# Patient Record
Sex: Female | Born: 1970 | Race: White | Hispanic: No | Marital: Married | Smoking: Never smoker
Health system: Southern US, Community
[De-identification: ages and names within clinical notes are randomized; demographics above are authoritative.]

## PROBLEM LIST (undated history)

## (undated) DIAGNOSIS — K219 Gastro-esophageal reflux disease without esophagitis: Secondary | ICD-10-CM

## (undated) DIAGNOSIS — F419 Anxiety disorder, unspecified: Secondary | ICD-10-CM

## (undated) DIAGNOSIS — G4700 Insomnia, unspecified: Secondary | ICD-10-CM

## (undated) DIAGNOSIS — Q560 Hermaphroditism, not elsewhere classified: Secondary | ICD-10-CM

## (undated) HISTORY — DX: Hermaphroditism, not elsewhere classified: Q56.0

## (undated) HISTORY — DX: Insomnia, unspecified: G47.00

## (undated) HISTORY — PX: AUGMENTATION MAMMAPLASTY: SUR837

## (undated) HISTORY — PX: TONSILLECTOMY: SUR1361

## (undated) HISTORY — PX: OTHER SURGICAL HISTORY: SHX169

## (undated) HISTORY — PX: BREAST ENHANCEMENT SURGERY: SHX7

## (undated) HISTORY — DX: Anxiety disorder, unspecified: F41.9

## (undated) HISTORY — DX: Gastro-esophageal reflux disease without esophagitis: K21.9

---

## 2006-08-07 ENCOUNTER — Ambulatory Visit: Payer: Self-pay | Admitting: Family Medicine

## 2006-08-09 ENCOUNTER — Ambulatory Visit: Payer: Self-pay | Admitting: Family Medicine

## 2006-08-09 LAB — CONVERTED CEMR LAB
ALT: 15 units/L (ref 0–35)
AST: 20 units/L (ref 0–37)
Alkaline Phosphatase: 40 units/L (ref 39–117)
Basophils Relative: 0 % (ref 0–1)
Bilirubin, Direct: 0.1 mg/dL (ref 0.0–0.3)
Chloride: 104 meq/L (ref 96–112)
Eosinophils Absolute: 0.1 10*3/uL (ref 0.0–0.7)
Indirect Bilirubin: 0.3 mg/dL (ref 0.0–0.9)
Lymphs Abs: 2.2 10*3/uL (ref 0.7–3.3)
MCHC: 34.4 g/dL (ref 30.0–36.0)
MCV: 96.6 fL (ref 78.0–100.0)
Neutro Abs: 2.4 10*3/uL (ref 1.7–7.7)
Neutrophils Relative %: 48 % (ref 43–77)
Platelets: 220 10*3/uL (ref 150–400)
Potassium: 4.1 meq/L (ref 3.5–5.3)
RBC: 3.79 M/uL — ABNORMAL LOW (ref 3.87–5.11)
Sodium: 138 meq/L (ref 135–145)
Total Bilirubin: 0.4 mg/dL (ref 0.3–1.2)
WBC: 5 10*3/uL (ref 4.0–10.5)

## 2006-11-04 DIAGNOSIS — K219 Gastro-esophageal reflux disease without esophagitis: Secondary | ICD-10-CM | POA: Insufficient documentation

## 2007-05-23 ENCOUNTER — Ambulatory Visit: Payer: Self-pay | Admitting: Family Medicine

## 2007-05-23 DIAGNOSIS — G4700 Insomnia, unspecified: Secondary | ICD-10-CM | POA: Insufficient documentation

## 2007-05-23 DIAGNOSIS — R0989 Other specified symptoms and signs involving the circulatory and respiratory systems: Secondary | ICD-10-CM

## 2007-05-23 DIAGNOSIS — R0609 Other forms of dyspnea: Secondary | ICD-10-CM

## 2007-05-23 DIAGNOSIS — J309 Allergic rhinitis, unspecified: Secondary | ICD-10-CM | POA: Insufficient documentation

## 2007-06-24 ENCOUNTER — Telehealth: Payer: Self-pay | Admitting: Family Medicine

## 2007-06-25 ENCOUNTER — Telehealth: Payer: Self-pay | Admitting: Family Medicine

## 2007-08-12 ENCOUNTER — Encounter: Payer: Self-pay | Admitting: Family Medicine

## 2007-08-12 ENCOUNTER — Telehealth (INDEPENDENT_AMBULATORY_CARE_PROVIDER_SITE_OTHER): Payer: Self-pay | Admitting: *Deleted

## 2007-09-01 ENCOUNTER — Telehealth: Payer: Self-pay | Admitting: Family Medicine

## 2007-10-23 ENCOUNTER — Telehealth: Payer: Self-pay | Admitting: Family Medicine

## 2007-12-11 ENCOUNTER — Telehealth: Payer: Self-pay | Admitting: Family Medicine

## 2007-12-26 ENCOUNTER — Ambulatory Visit: Payer: Self-pay | Admitting: Family Medicine

## 2007-12-26 DIAGNOSIS — H109 Unspecified conjunctivitis: Secondary | ICD-10-CM | POA: Insufficient documentation

## 2008-04-02 ENCOUNTER — Telehealth: Payer: Self-pay | Admitting: Family Medicine

## 2008-04-08 ENCOUNTER — Ambulatory Visit: Payer: Self-pay | Admitting: Family Medicine

## 2008-04-08 DIAGNOSIS — L259 Unspecified contact dermatitis, unspecified cause: Secondary | ICD-10-CM

## 2008-05-27 ENCOUNTER — Telehealth: Payer: Self-pay | Admitting: Family Medicine

## 2008-08-25 ENCOUNTER — Telehealth: Payer: Self-pay | Admitting: Family Medicine

## 2008-08-26 ENCOUNTER — Encounter: Payer: Self-pay | Admitting: Family Medicine

## 2008-10-07 ENCOUNTER — Telehealth (INDEPENDENT_AMBULATORY_CARE_PROVIDER_SITE_OTHER): Payer: Self-pay | Admitting: *Deleted

## 2008-10-08 ENCOUNTER — Telehealth: Payer: Self-pay | Admitting: Family Medicine

## 2008-10-08 DIAGNOSIS — R002 Palpitations: Secondary | ICD-10-CM

## 2008-10-14 ENCOUNTER — Encounter: Payer: Self-pay | Admitting: Family Medicine

## 2008-10-14 ENCOUNTER — Ambulatory Visit: Payer: Self-pay

## 2008-10-22 ENCOUNTER — Ambulatory Visit: Payer: Self-pay | Admitting: Family Medicine

## 2008-10-22 DIAGNOSIS — J069 Acute upper respiratory infection, unspecified: Secondary | ICD-10-CM | POA: Insufficient documentation

## 2008-12-02 ENCOUNTER — Telehealth: Payer: Self-pay | Admitting: Family Medicine

## 2009-02-09 ENCOUNTER — Telehealth: Payer: Self-pay | Admitting: Family Medicine

## 2009-02-09 ENCOUNTER — Encounter: Payer: Self-pay | Admitting: Family Medicine

## 2009-03-31 ENCOUNTER — Telehealth: Payer: Self-pay | Admitting: Family Medicine

## 2009-06-23 ENCOUNTER — Telehealth: Payer: Self-pay | Admitting: Family Medicine

## 2009-08-03 ENCOUNTER — Telehealth: Payer: Self-pay | Admitting: Family Medicine

## 2009-08-03 DIAGNOSIS — K649 Unspecified hemorrhoids: Secondary | ICD-10-CM

## 2009-09-02 ENCOUNTER — Telehealth: Payer: Self-pay | Admitting: Family Medicine

## 2009-09-05 ENCOUNTER — Telehealth (INDEPENDENT_AMBULATORY_CARE_PROVIDER_SITE_OTHER): Payer: Self-pay | Admitting: *Deleted

## 2009-09-06 ENCOUNTER — Ambulatory Visit: Payer: Self-pay | Admitting: Family Medicine

## 2009-09-06 ENCOUNTER — Telehealth: Payer: Self-pay | Admitting: Family Medicine

## 2009-09-06 DIAGNOSIS — F411 Generalized anxiety disorder: Secondary | ICD-10-CM | POA: Insufficient documentation

## 2009-09-20 ENCOUNTER — Telehealth (INDEPENDENT_AMBULATORY_CARE_PROVIDER_SITE_OTHER): Payer: Self-pay

## 2009-11-03 ENCOUNTER — Encounter (INDEPENDENT_AMBULATORY_CARE_PROVIDER_SITE_OTHER): Payer: Self-pay | Admitting: *Deleted

## 2009-11-04 ENCOUNTER — Ambulatory Visit: Payer: Self-pay | Admitting: Family Medicine

## 2009-11-04 LAB — CONVERTED CEMR LAB
Bilirubin Urine: NEGATIVE
Ketones, urine, test strip: NEGATIVE
Protein, U semiquant: NEGATIVE
Urobilinogen, UA: 0.2

## 2009-11-07 ENCOUNTER — Telehealth: Payer: Self-pay | Admitting: Family Medicine

## 2009-11-07 LAB — CONVERTED CEMR LAB
ALT: 17 units/L (ref 0–35)
BUN: 15 mg/dL (ref 6–23)
Bilirubin, Direct: 0.1 mg/dL (ref 0.0–0.3)
Calcium: 8.9 mg/dL (ref 8.4–10.5)
Chloride: 108 meq/L (ref 96–112)
Cholesterol: 132 mg/dL (ref 0–200)
Creatinine, Ser: 0.8 mg/dL (ref 0.4–1.2)
Eosinophils Absolute: 0.1 10*3/uL (ref 0.0–0.7)
Eosinophils Relative: 1 % (ref 0.0–5.0)
HDL: 60.5 mg/dL (ref >39.00)
LDL Cholesterol: 63 mg/dL (ref 0–99)
MCV: 100.2 fL — ABNORMAL HIGH (ref 78.0–100.0)
Monocytes Absolute: 0.4 10*3/uL (ref 0.1–1.0)
Neutrophils Relative %: 74.2 % (ref 43.0–77.0)
Platelets: 184 10*3/uL (ref 150.0–400.0)
Total Bilirubin: 0.6 mg/dL (ref 0.3–1.2)
Triglycerides: 43 mg/dL (ref 0.0–149.0)
VLDL: 8.6 mg/dL (ref 0.0–40.0)
WBC: 9.1 10*3/uL (ref 4.5–10.5)

## 2009-12-29 ENCOUNTER — Telehealth: Payer: Self-pay | Admitting: Family Medicine

## 2010-03-01 ENCOUNTER — Telehealth: Payer: Self-pay | Admitting: Family Medicine

## 2010-03-29 NOTE — Progress Notes (Signed)
Summary: Pt req partial refill of Zolpidem to last until ov on 09/06/09  Phone Note Call from Patient Call back at [facility] Phone [phone]   Caller: Patient Summary of Call: Pt called and needs a partial refill of Zolpidem, to last until ov on 09/06/09. Call in to [facility] [address] and [address].    Initial call taken by: Lucy Antigua,  September 02, 2009 4:32 PM  Follow-up for Phone Call        call in Zolpidem 10 mg at bedtime, #30 with no rf Follow-up by: Nelwyn Salisbury MD,  September 05, 2009 8:34 AM    Prescriptions: AMBIEN 10 MG  TABS (ZOLPIDEM TARTRATE) take a half  to one tablet by mouth every night at bedtime as needed for insomnia  #30 x 0   Entered by:   Raechel Ache, RN   Authorized by:   Nelwyn Salisbury MD   Signed by:   Raechel Ache, RN on 09/05/2009   Method used:   Historical   RxID:   [id number]

## 2010-03-29 NOTE — Letter (Signed)
Summary: [facility] Referral (unable to schedule) Notification  [facility]  [address] [place], [place] [address]   Phone: [phone]  Fax: [phone]      November 03, 2009 Cheryl Ritter 03-Sep-1970 MRN: [id number]   [facility] [address] [place], [place]  [address]   Dear Dr.Fry:   Thank you for your kind referral of the above patient. We have attempted to schedule the recommended Consultation but have been unable to schedule because:  _X_ The patient was not available by phone and/or has not returned our calls.  __ The patient declined to schedule the procedure at this time.  We appreciate the referral and hope that we will have the opportunity to treat this patient in the future.    Sincerely,   [facility]  Vania Rea. Jarold Motto M.D. Hedwig Morton. Juanda Chance M.D. Venita Lick. Russella Dar M.D. Wilhemina Bonito. Marina Goodell M.D. Barbette Hair. Arlyce Dice M.D. Iva Boop M.D. Cheron Every.D.

## 2010-03-29 NOTE — Progress Notes (Signed)
Summary: new pharmacy request  Phone Note Refill Request Call back at Home Phone [phone] Message from:  Patient---live call  Refills Requested: Medication #1:  FEXOFENADINE HCL 180 MG TABS 1 once daily as needed allergies New pharmacy----[facility] [address]  Initial call taken by: Warnell Forester,  June 23, 2009 10:27 AM  Follow-up for Phone Call        call in #30 with 11 rf Follow-up by: Nelwyn Salisbury MD,  June 23, 2009 11:54 AM    Prescriptions: FEXOFENADINE HCL 180 MG TABS (FEXOFENADINE HCL) 1 once daily as needed allergies  #30 x 11   Entered by:   Raechel Ache, RN   Authorized by:   Nelwyn Salisbury MD   Signed by:   Raechel Ache, RN on 06/23/2009   Method used:   Electronically to        [facility]. [address] [phone]* (retail)       [address]       [place], [place]  [address]       Ph: [phone] or [phone]       Fax: [phone]   RxID:   [id number]

## 2010-03-29 NOTE — Progress Notes (Signed)
Summary: rx zolpidem  Phone Note From Pharmacy   Caller: [facility] [address]. [phone]* Reason for Call: Needs renewal Summary of Call: refill zolpidem Initial call taken by: Pura Spice, RN,  November 07, 2009 4:44 PM  Follow-up for Phone Call        I gave her a 6 month rx when she was here on 11-04-09 Follow-up by: Nelwyn Salisbury MD,  November 07, 2009 5:07 PM

## 2010-03-29 NOTE — Progress Notes (Signed)
Summary: new rx needed  Phone Note Refill Request Call back at Home Phone [phone] Message from:  Patient---live call  Refills Requested: Medication #1:  FLUOXETINE HCL 20 MG CAPS once daily.  Medication #2:  generic [facility]zac [facility]-- [phone]  Initial call taken by: Warnell Forester,  December 29, 2009 9:17 AM  Follow-up for Phone Call        we already gave her a one year supply on 11-04-09 Follow-up by: Nelwyn Salisbury MD,  December 29, 2009 9:45 AM  Additional Follow-up for Phone Call Additional follow up Details #1::        pt aware Additional Follow-up by: Pura Spice, RN,  December 29, 2009 10:09 AM

## 2010-03-29 NOTE — Progress Notes (Signed)
Summary: refill [employer] Note From Pharmacy   Caller: [facility] [address]. [phone]* Call For: Dejia Ebron  Summary of Call: refill ambien cr 6.25mg  1 by mouth at bedtime Initial call taken by: Alfred Levins, CMA,  March 31, 2009 9:52 AM  Follow-up for Phone Call        call in #30 with 5 rf Follow-up by: Nelwyn Salisbury MD,  March 31, 2009 10:48 AM  Additional Follow-up for Phone Call Additional follow up Details #1::        Phone call completed, Pharmacist called Additional Follow-up by: Alfred Levins, CMA,  March 31, 2009 10:58 AM    Prescriptions: AMBIEN 10 MG  TABS (ZOLPIDEM TARTRATE) take a half  to one tablet by mouth every night at bedtime as needed for insomnia  #30 x 5   Entered by:   Alfred Levins, CMA   Authorized by:   Nelwyn Salisbury MD   Signed by:   Alfred Levins, CMA on 03/31/2009   Method used:   Telephoned to ...       [facility] [address] [phone]* (retail)       [address]       [place], [place]  [address]       Ph: [phone] or [phone]       Fax: [phone]   RxID:   [id number]

## 2010-03-29 NOTE — Progress Notes (Signed)
Summary: Pt has questions re: new med prescribed  Phone Note Call from Patient Call back at [facility] Phone [phone]   Caller: Patient Summary of Call: Pt called and has some questions re: the new medicine she just prescribed. Pls call.  Initial call taken by: Lucy Antigua,  September 06, 2009 4:22 PM  Follow-up for Phone Call        lmtcb Follow-up by: Raechel Ache, RN,  September 06, 2009 4:29 PM

## 2010-03-29 NOTE — Progress Notes (Signed)
Summary: Call-A-Nurse Report    Call-A-Nurse Triage Call Report Triage Record Num: [id number] Operator: Octaviano Glow Patient Name: Cheryl Ritter Call Date & Time: 09/03/2009 3:35:43PM Patient Phone: [phone] PCP: Tera Mater. Clent Ridges Patient Gender: Female PCP Fax : [phone] Patient DOB: 16-Oct-1970 Practice Name: [facility] Reason for Call: Pt calling about MD calling Ambien 10 mg to pharmacy and it was supposed to be Ambien 6.5 mg XR. Dr Laury Axon paged. Pt advised I would call her back after speaking to MD on call. Protocol(s) Used: Medication Question Calls, No Triage (Adults) Recommended Outcome per Protocol: Call Provider within 24 Hours Reason for Outcome: Caller has non urgent medication question about med that PCP prescribed and triager unable to answer question Care Advice:  ~ 09/03/2009 3:50:09PM Page 1 of 1 [id number]

## 2010-03-29 NOTE — Progress Notes (Signed)
Summary: gi referral  Phone Note Call from Patient Call back at Work Phone [phone]   Caller: Patient---live call Reason for Call: Referral Summary of Call: Wants GI doctor. Having hemorrhoids issues. Please referral. Do not want to see Dr Russella Dar. (friend). Initial call taken by: Warnell Forester,  August 03, 2009 10:22 AM  Follow-up for Phone Call        please refer her to Dr. Yancey Flemings Follow-up by: Nelwyn Salisbury MD,  August 10, 2009 9:02 AM  New Problems: UNSPECIFIED HEMORRHOIDS WITH OTHER COMPLICATION (ICD-455.8)   New Problems: UNSPECIFIED HEMORRHOIDS WITH OTHER COMPLICATION (ICD-455.8)

## 2010-03-29 NOTE — Consult Note (Signed)
Summary: [facility]   Imported By: Maryln Gottron 03/04/2009 13:32:49  _____________________________________________________________________  External Attachment:    Type:   Image     Comment:   External Document

## 2010-03-29 NOTE — Progress Notes (Signed)
Summary: rx furosemide   Phone Note Call from Patient   Caller: Patient Summary of Call: wants to know if  can have refill on furosemide states does not use all time   [facility] [address]  Initial call taken by: Pura Spice, RN,  December 29, 2009 10:11 AM  Follow-up for Phone Call        call in Furosemide 20 mg once daily as needed for swelling, #30 with 11 rf  Follow-up by: Nelwyn Salisbury MD,  December 29, 2009 10:57 AM  Additional Follow-up for Phone Call Additional follow up Details #1::        done  pt aware Additional Follow-up by: Pura Spice, RN,  December 29, 2009 11:49 AM    New/Updated Medications: FUROSEMIDE 20 MG TABS (FUROSEMIDE) 1 once daily as needed swelling Prescriptions: FUROSEMIDE 20 MG TABS (FUROSEMIDE) 1 once daily as needed swelling  #30 x 11   Entered by:   Pura Spice, RN   Authorized by:   Nelwyn Salisbury MD   Signed by:   Pura Spice, RN on 12/29/2009   Method used:   Electronically to        [facility] [address] [phone]* (retail)       [address]       [place], [place]  [address]       Ph: [phone] or [phone]       Fax: [phone]   RxID:   [id number]

## 2010-03-29 NOTE — Progress Notes (Signed)
Summary: Medication Question  Phone Note Call from Patient Call back at Home Phone [phone]   Caller: Patient Summary of Call: Has question about medication Initial call taken by: Georgian Co,  September 20, 2009 2:34 PM  Follow-up for Phone Call        lmtcb Follow-up by: Raechel Ache, RN,  September 20, 2009 3:52 PM

## 2010-03-29 NOTE — Assessment & Plan Note (Signed)
Summary: med check/refill/cjr   Vital Signs:  Patient profile:   40 year old female Weight:      128 pounds BP sitting:   98 / 68  (left arm) Cuff size:   regular  Vitals Entered By: Raechel Ache, RN (September 06, 2009 10:03 AM) CC: Med check.   History of Present Illness: Here to discuss sleep issues and general stress issues in her life. She has used Zolpidem for some time, and it helps, but it seems to run out before morning. She has had better luck with Ambien CR. Otherwise she has been under a lot of stress in the past few months with job and family issues. She finds it hard to relax, she worries about things, and she tends to get obsessive or compulsive about things. She tends to lose her temper quickly. She denies much sadness but seems to have more anxiety problems. She exercises regularly.   Allergies: No Known Drug Allergies  Past History:  Past Medical History: Reviewed history from 12/26/2007 and no changes required. GERD insomnia Allergic rhinitis  Review of Systems  The patient denies anorexia, fever, weight loss, weight gain, vision loss, decreased hearing, hoarseness, chest pain, syncope, dyspnea on exertion, peripheral edema, prolonged cough, headaches, hemoptysis, abdominal pain, melena, hematochezia, severe indigestion/heartburn, hematuria, incontinence, genital sores, muscle weakness, suspicious skin lesions, transient blindness, difficulty walking, depression, unusual weight change, abnormal bleeding, enlarged lymph nodes, angioedema, breast masses, and testicular masses.    Physical Exam  General:  Well-developed,well-nourished,in no acute distress; alert,appropriate and cooperative throughout examination Psych:  Cognition and judgment appear intact. Alert and cooperative with normal attention span and concentration. No apparent delusions, illusions, hallucinations   Impression & Recommendations:  Problem # 1:  ANXIETY STATE, UNSPECIFIED (ICD-300.00)  Her  updated medication list for this problem includes:    Fluoxetine Hcl 10 Mg Tabs (Fluoxetine hcl) ..... Once daily  Problem # 2:  INSOMNIA (ICD-780.52)  The following medications were removed from the medication list:    Ambien 10 Mg Tabs (Zolpidem tartrate) .Marland Kitchen... Take a half  to one tablet by mouth every night at bedtime as needed for insomnia    Zolpidem Tartrate 10 Mg Tabs (Zolpidem tartrate) .Marland Kitchen... 1 by mouth at bedtime Her updated medication list for this problem includes:    Ambien Cr 6.25 Mg Cr-tabs (Zolpidem tartrate) .Marland Kitchen... At bedtime  Complete Medication List: 1)  Multivitamins Tabs (Multiple vitamin) .Marland Kitchen.. 1 by mouth once daily 2)  Bcp  .Marland Kitchen.. 1 by mouth once daily 3)  Fluoxetine Hcl 10 Mg Tabs (Fluoxetine hcl) .... Once daily 4)  Ambien Cr 6.25 Mg Cr-tabs (Zolpidem tartrate) .... At bedtime  Patient Instructions: 1)  Try Prozac. Switch to Ambien CR.  2)  Please schedule a follow-up appointment in 2 weeks.  Prescriptions: FLUOXETINE HCL 10 MG TABS (FLUOXETINE HCL) once daily  #30 x 2   Entered and Authorized by:   Nelwyn Salisbury MD   Signed by:   Nelwyn Salisbury MD on 09/06/2009   Method used:   Print then Give to Patient   RxID:   [id number] AMBIEN CR 6.25 MG CR-TABS (ZOLPIDEM TARTRATE) at bedtime  #30 x 5   Entered and Authorized by:   Nelwyn Salisbury MD   Signed by:   Nelwyn Salisbury MD on 09/06/2009   Method used:   Print then Give to Patient   RxID:   [id number]

## 2010-03-29 NOTE — Assessment & Plan Note (Signed)
Summary: PT WILL COME IN FASTING/NJR   Vital Signs:  Patient profile:   40 year old female Height:      68 inches Weight:      128.5 pounds BMI:     19.61 O2 Sat:      97 % Temp:     98.2 degrees F Pulse rate:   72 / minute  Vitals Entered By: Pura Spice, RN (November 04, 2009 9:13 AM) CC: cpx without pap sees Dr Altamese Juniata. Fasting for labs.    History of Present Illness: 40 yr old female for a cpx. She feels well in general except for some oncreased stress recently, and this has caused her to feel tightness and pain in the neck and shoulders. She has not gotten much exercise lately. the Prozac she started on this summer worked well for a time, but now she thinks the dose may need to be increased. Sleeping well.   Allergies (verified): No Known Drug Allergies  Past History:  Past Medical History: GERD insomnia Allergic rhinitis sees Dr. Altamese [place] in [facility] for GYN Anxiety  Past Surgical History: Reviewed history from 11/04/2006 and no changes required. Caesarean section x2 Breast Augmentation Bilat. Tonsillectomy  Family History: Reviewed history and no changes required. Family History of Skin cancer Family History of Stroke F 1st degree relative <60 Family History of CAD Female 1st degree relative <60 Family History of CAD Female 1st degree relative <50  Social History: Reviewed history from 11/04/2006 and no changes required. Occupation: Married Never Smoked Alcohol use-yes  Review of Systems  The patient denies anorexia, fever, weight loss, weight gain, vision loss, decreased hearing, hoarseness, chest pain, syncope, dyspnea on exertion, peripheral edema, prolonged cough, headaches, hemoptysis, abdominal pain, melena, hematochezia, severe indigestion/heartburn, hematuria, incontinence, genital sores, muscle weakness, suspicious skin lesions, transient blindness, difficulty walking, depression, unusual weight change, abnormal bleeding, enlarged lymph nodes,  angioedema, breast masses, and testicular masses.    Physical Exam  General:  Well-developed,well-nourished,in no acute distress; alert,appropriate and cooperative throughout examination Head:  Normocephalic and atraumatic without obvious abnormalities. No apparent alopecia or balding. Eyes:  No corneal or conjunctival inflammation noted. EOMI. Perrla. Funduscopic exam benign, without hemorrhages, exudates or papilledema. Vision grossly normal. Ears:  External ear exam shows no significant lesions or deformities.  Otoscopic examination reveals clear canals, tympanic membranes are intact bilaterally without bulging, retraction, inflammation or discharge. Hearing is grossly normal bilaterally. Nose:  External nasal examination shows no deformity or inflammation. Nasal mucosa are pink and moist without lesions or exudates. Mouth:  Oral mucosa and oropharynx without lesions or exudates.  Teeth in good repair. Neck:  No deformities, masses, or tenderness noted. Chest Wall:  No deformities, masses, or tenderness noted. Lungs:  Normal respiratory effort, chest expands symmetrically. Lungs are clear to auscultation, no crackles or wheezes. Heart:  Normal rate and regular rhythm. S1 and S2 normal without gallop, murmur, click, rub or other extra sounds. Abdomen:  Bowel sounds positive,abdomen soft and non-tender without masses, organomegaly or hernias noted. Msk:  No deformity or scoliosis noted of thoracic or lumbar spine.   Pulses:  R and L carotid,radial,femoral,dorsalis pedis and posterior tibial pulses are full and equal bilaterally Extremities:  No clubbing, cyanosis, edema, or deformity noted with normal full range of motion of all joints.   Neurologic:  No cranial nerve deficits noted. Station and gait are normal. Plantar reflexes are down-going bilaterally. DTRs are symmetrical throughout. Sensory, motor and coordinative functions appear intact. Skin:  Intact  without suspicious lesions or  rashes Cervical Nodes:  No lymphadenopathy noted Axillary Nodes:  No palpable lymphadenopathy Inguinal Nodes:  No significant adenopathy Psych:  Cognition and judgment appear intact. Alert and cooperative with normal attention span and concentration. No apparent delusions, illusions, hallucinations   Impression & Recommendations:  Problem # 1:  HEALTH MAINTENANCE EXAM (ICD-V70.0)  Orders: Venipuncture ([address]) TLB-Lipid Panel (80061-LIPID) TLB-BMP (Basic Metabolic Panel-BMET) (80048-METABOL) TLB-CBC Platelet - w/Differential (85025-CBCD) TLB-Hepatic/Liver Function Pnl (80076-HEPATIC) TLB-TSH (Thyroid Stimulating Hormone) (84443-TSH) UA Dipstick w/o Micro (automated)  (81003) Specimen Handling ([address])  Complete Medication List: 1)  Multivitamins Tabs (Multiple vitamin) .Marland Kitchen.. 1 by mouth once daily 2)  Bcp  .Marland Kitchen.. 1 by mouth once daily 3)  Ambien Cr 6.25 Mg Cr-tabs (Zolpidem tartrate) .... At bedtime 4)  Fluoxetine Hcl 20 Mg Caps (Fluoxetine hcl) .... Once daily  Patient Instructions: 1)  Get fasting labs today. Increase Prozac to 20 mg a day. Suggested she get back into yoga and try massage for her neck tightness.  Prescriptions: AMBIEN CR 6.25 MG CR-TABS (ZOLPIDEM TARTRATE) at bedtime  #30 x 5   Entered and Authorized by:   Nelwyn Salisbury MD   Signed by:   Nelwyn Salisbury MD on 11/04/2009   Method used:   Print then Give to Patient   RxID:   [id number] FLUOXETINE HCL 20 MG CAPS (FLUOXETINE HCL) once daily  #30 x 11   Entered and Authorized by:   Nelwyn Salisbury MD   Signed by:   Nelwyn Salisbury MD on 11/04/2009   Method used:   Print then Give to Patient   RxID:   [id number]   Laboratory Results   Urine Tests  Date/Time Recieved: November 04, 2009 12:36 PM  Date/Time Reported: November 04, 2009 12:36 PM    Routine Urinalysis   Color: yellow Appearance: Clear Glucose: negative   (Normal Range: Negative) Bilirubin: negative   (Normal Range: Negative) Ketone:  negative   (Normal Range: Negative) Spec. Gravity: >=1.030   (Normal Range: 1.003-1.035) Blood: 1+   (Normal Range: Negative) pH: 5.5   (Normal Range: 5.0-8.0) Protein: negative   (Normal Range: Negative) Urobilinogen: 0.2   (Normal Range: 0-1) Nitrite: negative   (Normal Range: Negative) Leukocyte Esterace: negative   (Normal Range: Negative)    Comments: Wynona Canes, CMA  November 04, 2009 12:36 PM

## 2010-03-30 NOTE — Progress Notes (Signed)
  Phone Note From Pharmacy   Caller: [facility] [address]. [phone]* Call For: Gershon Crane MD  Summary of Call: refill fluoxetine 10mg  tabs. last refill date 01/07/2010 Initial call taken by: Kyung Rudd, CMA,  March 01, 2010 9:45 AM  Follow-up for Phone Call        done Follow-up by: Nelwyn Salisbury MD,  March 02, 2010 8:11 AM    New/Updated Medications: FLUOXETINE HCL 10 MG CAPS (FLUOXETINE HCL) once daily Prescriptions: FLUOXETINE HCL 10 MG CAPS (FLUOXETINE HCL) once daily  #30 x 11   Entered and Authorized by:   Nelwyn Salisbury MD   Signed by:   Nelwyn Salisbury MD on 03/02/2010   Method used:   Electronically to        [facility] [address] [phone]* (retail)       [address]       [place], [place]  [address]       Ph: [phone] or [phone]       Fax: [phone]   RxID:   [id number]

## 2010-05-08 ENCOUNTER — Other Ambulatory Visit: Payer: Self-pay | Admitting: Family Medicine

## 2010-05-08 NOTE — Telephone Encounter (Signed)
Duplicate request

## 2010-05-09 NOTE — Telephone Encounter (Signed)
Duplicate

## 2010-07-11 NOTE — Assessment & Plan Note (Signed)
[facility] OFFICE NOTE   Cheryl Ritter, Cheryl Ritter                          MRN:          [id number]  DATE:08/07/2006                            DOB:          Mar 15, 1970    This is a 40 year old woman here to establish with our practice who is  complaining of abdominal cramps and diarrhea. About 3 days ago she began  having diffuse abdominal cramps which come and go. She has also had a  lot of bloating and is passing a lot of gas. She has had intermittent  diarrhea throughout this time as well. She has been slightly nauseated  at times but has never vomited. There has been no fever to her  knowledge. No urinary symptoms. Her appetite is good, although after  eating a little bit the cramps get worse so she has not been eating very  much. She does have good fluid intake. She wonders if this could be  related to eating a salad with raw tomatoes about 3 days ago, given the  recent coverage of Salmonella infections in the national media lately.  All the other family members are fine. She has no particular history of  heartburn or indigestion.   OTHER PAST MEDICAL HISTORY:  She has had 2 C-sections. She has had  breast augmentations bilaterally, and she has had a tonsillectomy. She  does see a gynecologist on a regular basis.   ALLERGIES:  None.   CURRENT MEDICATIONS:  1. Ortho novum daily.  2. Prenatal vitamins daily.   HABITS:  She does not use tobacco. She drinks some alcohol.   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   OBJECTIVE:  Temperature 97.1 degrees, blood pressure 110/72, pulse 74  and regular.  IN GENERAL: She is in no acute distress.  BACK: Shows no CVA tenderness.  ABDOMEN: Soft, normal bowel sounds. No masses or hepatosplenomegaly. She  is mild-to-moderately tender diffusely in all 4 quadrants. There is no  rebound and no guarding.   ASSESSMENT/PLAN:  Bacterial gastroenteritis, I told her it was doubtful  this was Salmonella infection but we will cover for that possibility. I  gave her 7 days of Cipro 500 mg to take b.i.d.  Also Lomotil to use q.i.d. as needed for cramps and diarrhea. She is to  drink plenty of fluids  especially water and Gatorade. I did ask her to contact me tomorrow to  let me know how she is doing.     Tera Mater. Clent Ridges, MD  Electronically Signed    SAF/MedQ  DD: 08/08/2006  DT: 08/08/2006  Job #: [id number]

## 2010-09-08 ENCOUNTER — Telehealth: Payer: Self-pay | Admitting: *Deleted

## 2010-09-08 NOTE — Telephone Encounter (Signed)
Pt last here on 11/04/09.

## 2010-09-08 NOTE — Telephone Encounter (Signed)
Insurance will not cover zolpidem 6.25mg  and insurance is saying that pt needs to be on 10 mg and if that does not work than she can go back on 6.25mg .  [facility] [address] Pt is wanting a qty of 5 called in until Dr Clent Ridges gets back into the office cause she is out of med

## 2010-09-08 NOTE — Telephone Encounter (Signed)
Per Dr. Fabian Sharp- let fry advise

## 2010-09-11 ENCOUNTER — Telehealth: Payer: Self-pay | Admitting: Family Medicine

## 2010-09-11 MED ORDER — ZOLPIDEM TARTRATE 10 MG PO TABS: 10.0000 mg | ORAL_TABLET | Freq: Every evening | ORAL | Status: DC | PRN

## 2010-09-11 NOTE — Telephone Encounter (Signed)
Call in Zolpidem 10 mg qhs, #30 with 5 rf 

## 2010-09-11 NOTE — Telephone Encounter (Signed)
Pharmacy is not showing prescription for FLUoxetine (PROZAC) 10 MG capsule in there system. Patient said she still has plenty of refills remaining. Patient needs refill. Please Contact.  Pharmacy: [facility] [address]

## 2010-09-12 NOTE — Telephone Encounter (Signed)
I spoke with pt and I called pharmacy. Script is going to be filled. Also Dr. Clent Ridges does not recommend bettlejuice for the removal of daughters wart. He said that he can freeze the wart off. Pt can schedule a office visit if she decides to do this.

## 2010-09-19 ENCOUNTER — Ambulatory Visit: Payer: Self-pay | Admitting: Family Medicine

## 2010-09-19 ENCOUNTER — Encounter: Payer: Self-pay | Admitting: Family Medicine

## 2010-09-19 ENCOUNTER — Ambulatory Visit (INDEPENDENT_AMBULATORY_CARE_PROVIDER_SITE_OTHER): Payer: 59 | Admitting: Family Medicine

## 2010-09-19 VITALS — BP 102/70 | HR 80 | Temp 98.8°F | Wt 135.0 lb

## 2010-09-19 DIAGNOSIS — L509 Urticaria, unspecified: Secondary | ICD-10-CM

## 2010-09-19 MED ORDER — CETIRIZINE HCL 10 MG PO CHEW: 10.0000 mg | CHEWABLE_TABLET | Freq: Two times a day (BID) | ORAL | Status: DC

## 2010-09-19 NOTE — Progress Notes (Signed)
  Subjective:    Patient ID: Cheryl Ritter, female    DOB: Jan 28, 1971, 40 y.o.   MRN: [id number]  HPI Here for an intermittent itchy rash which started about 2 weeks ago. It starts on the scalp or chest and then moves down to the trunk, arms , and legs. It may disappear for a few days and then returns. Benadryl helps. She has had no new medications or foods recently. She has been to the beach several times, so sun exposure may play a role. She denies any particular stressors lately. In fact she took herself off Prozac about one months ago since she did not feel she needed it any longer.    Review of Systems  Constitutional: Negative.   Skin: Positive for rash.       Objective:   Physical Exam  Constitutional: She appears well-developed and well-nourished.  Skin:       Fine scattered erythematous urticaria over the chest and upper arms          Assessment & Plan:  This is hives, and we do not have a clear etiology for this. Treat with Zyrtec to take bid until cooler fall weather comes. Avoid sun exposure. Recheck prn

## 2010-09-21 ENCOUNTER — Telehealth: Payer: Self-pay | Admitting: Family Medicine

## 2010-09-21 NOTE — Telephone Encounter (Signed)
Pt was in 7/24 for hives Dr. Clent Ridges prescribed zyrtec pt doesn't like the way it makes her feel. Has locoid cream which seems to be working almost out requesting Rx for cream to be called to the [facility] on [address].

## 2010-09-22 NOTE — Telephone Encounter (Signed)
Script called in

## 2010-09-22 NOTE — Telephone Encounter (Signed)
Call in Locoid 0.1% cream to apply tid prn, 60 grams with 5 rf

## 2010-11-10 ENCOUNTER — Telehealth: Payer: Self-pay | Admitting: Family Medicine

## 2010-11-10 NOTE — Telephone Encounter (Signed)
Pts called and is req Dr Clent Ridges to write a note to pts insurance company, that pt has tried the Ambien 10mg  and this med does not work for her and that it is necessary for her to take the zolpidem (AMBIEN CR) 6.25 MG CR. Pls send letter to [employer]. Pt said that Dr Clent Ridges should have the fax # for [facility], because they had sent a form to him.

## 2010-11-13 NOTE — Telephone Encounter (Signed)
I researched this, and this is what I found: patient's Prior Auth for the Ambien ER 6.25 was denied in July 2012 because she needed to try the 10mg  first, per her insurance. I see in the chart that shortly after that, Dr. Clent Ridges did call in the Zolpidem 10mg .  This most recent phone call from the pt that you sent me says she tried the 10mg , and that it did not work. She wants [place] to write a letter saying it didn't work. The problem I have with this, is that nowhere in the chart can I find anything about her telling Dr. Clent Ridges it didn't work. In fact, other than calling the 10mg  in, I can't find anything about it in the chart at all, other than it was D/C'd.  I would have to get Dr. Claris Che advisement on this, as I don't know the pt.

## 2010-11-15 NOTE — Telephone Encounter (Signed)
I will start working on it. Once I get the letter complete, I will bring to you for signature. Thanks!

## 2010-11-15 NOTE — Telephone Encounter (Signed)
I know this patient very well. She has informed me personally that the Zolpidem 10 mg did not work well and that she frequently woke up during the night while taking this. Please do a prior auth to get an okay for the Ambien CR 6.25.

## 2010-11-23 NOTE — Telephone Encounter (Signed)
Per [employer], we have to send our appeal by mail to their [employer]. I have submitted a copy of their denial of the Ambien CR as well as the letter you signed. I also copied the letter to be scanned into Cheryl Ritter's chart. I called her and explained where we were at with this, as well as that it had to go through the mail. Per [facility], once they make a decision, we and the patient are to receive a decision by mail.

## 2010-12-26 ENCOUNTER — Telehealth: Payer: Self-pay | Admitting: Family Medicine

## 2010-12-26 MED ORDER — ZOLPIDEM TARTRATE ER 6.25 MG PO TBCR: 6.2500 mg | EXTENDED_RELEASE_TABLET | Freq: Every evening | ORAL | Status: DC | PRN

## 2010-12-26 NOTE — Telephone Encounter (Signed)
Call in Ambien CR 6.25 mg qhs , #30 with 5 rf

## 2010-12-26 NOTE — Telephone Encounter (Signed)
Script called in

## 2010-12-26 NOTE — Telephone Encounter (Signed)
This patient's appeal to [employer] was approved. Can we please call in the Ambien Rx to the [facility] on [address]? Thanks!

## 2011-04-11 ENCOUNTER — Encounter: Payer: Self-pay | Admitting: Cardiovascular Disease

## 2011-04-11 ENCOUNTER — Ambulatory Visit (INDEPENDENT_AMBULATORY_CARE_PROVIDER_SITE_OTHER): Payer: 59 | Admitting: Cardiovascular Disease

## 2011-04-11 VITALS — BP 99/70 | HR 59 | Ht 67.0 in | Wt 134.0 lb

## 2011-04-11 DIAGNOSIS — R002 Palpitations: Secondary | ICD-10-CM

## 2011-04-11 DIAGNOSIS — J309 Allergic rhinitis, unspecified: Secondary | ICD-10-CM

## 2011-04-11 NOTE — Assessment & Plan Note (Signed)
Benign sounding.  Normal echo 2010 and normal ECG.  ETT to make sure HR response normal and no arrythmia with exercise.  Have primary check TSH/T4

## 2011-04-11 NOTE — Assessment & Plan Note (Signed)
Avoid stimulants and pseudofed with palpitations

## 2011-04-11 NOTE — Patient Instructions (Signed)

## 2011-04-11 NOTE — Progress Notes (Signed)
Patient ID: Cheryl Ritter, female   DOB: [birth year], 41 y.o.   MRN: [id number] 41 yo with palpitations  Referred by primary.  Had them over 5 years.  Somewhat worse lately.  Had normal echo 2 years ago.  Flip flops.  Not constant or sustained rapid beats.  Not necessarily worse with aerobic activity but may be worse lifting weights.  She is active at gym 3-5/week.  No SSCP occasional dsypnea.  Palpitations can occur at rest.  Alcohol about 4x/week but not excessive.  Home life is good.  Raising two children.  Family history of CAD, afib and stroke  ROS: Denies fever, malais, weight loss, blurry vision, decreased visual acuity, cough, sputum, SOB, hemoptysis, pleuritic pain, palpitaitons, heartburn, abdominal pain, melena, lower extremity edema, claudication, or rash.  All other systems reviewed and negative   General: Affect appropriate Healthy:  appears stated age HEENT: normal Neck supple with no adenopathy JVP normal no bruits no thyromegaly Lungs clear with no wheezing and good diaphragmatic motion Heart:  S1/S2 no murmur,rub, gallop or click PMI normal Abdomen: benighn, BS positve, no tenderness, no AAA no bruit.  No HSM or HJR Distal pulses intact with no bruits No edema Neuro non-focal Skin warm and dry No muscular weakness  Medications Current Outpatient Prescriptions  Medication Sig Dispense Refill  . fluocinonide cream (LIDEX) 0.05 % prn      . furosemide (LASIX) 20 MG tablet Take 20 mg by mouth daily as needed.        . Prenatal Vit-Fe Fumarate-FA (PRENAPLUS) 27-1 MG TABS Take 1 tablet by mouth Daily.      Marland Kitchen zolpidem (AMBIEN CR) 6.25 MG CR tablet Take 1 tablet (6.25 mg total) by mouth at bedtime as needed for sleep.  30 tablet  5    Allergies Review of patient's allergies indicates no known allergies.  Family History: Family History  Problem Relation Age of Onset  . Skin cancer Other   . Stroke Other     F 1st degree relative <60  . Coronary artery disease Other    female 1st degree relative <50; female 1st degree relative <60    Social History: History   Social History  . Marital Status: Married    Spouse Name: N/A    Number of Children: N/A  . Years of Education: N/A   Occupational History  . Not on file.   Social History Main Topics  . Smoking status: Never Smoker   . Smokeless tobacco: Not on file  . Alcohol Use: Yes  . Drug Use:   . Sexually Active:    Other Topics Concern  . Not on file   Social History Narrative  . No narrative on file    Electrocardiogram:  NSR rate 59 normal ecg  Assessment and Plan

## 2011-05-02 ENCOUNTER — Encounter: Payer: 59 | Admitting: Physician Assistant

## 2011-06-11 ENCOUNTER — Other Ambulatory Visit: Payer: Self-pay | Admitting: Family Medicine

## 2011-06-11 ENCOUNTER — Telehealth: Payer: Self-pay | Admitting: Family Medicine

## 2011-06-11 MED ORDER — ZOLPIDEM TARTRATE ER 6.25 MG PO TBCR: 6.2500 mg | EXTENDED_RELEASE_TABLET | Freq: Every evening | ORAL | Status: DC | PRN

## 2011-06-11 NOTE — Telephone Encounter (Signed)
Pt  Called to check status of refill pt was informed that it could take up to 72 hours to be filled

## 2011-06-11 NOTE — Telephone Encounter (Signed)
done

## 2011-06-11 NOTE — Telephone Encounter (Signed)
Patient called stating that she need a refill on her zolpidem 6.25. Please assist.

## 2011-06-27 ENCOUNTER — Other Ambulatory Visit: Payer: Self-pay | Admitting: Family Medicine

## 2011-12-03 ENCOUNTER — Other Ambulatory Visit: Payer: Self-pay | Admitting: Family Medicine

## 2011-12-04 NOTE — Telephone Encounter (Signed)
Call in #30 with no rf. She needs an OV soon  

## 2012-01-03 ENCOUNTER — Other Ambulatory Visit: Payer: Self-pay | Admitting: Family Medicine

## 2012-01-04 NOTE — Telephone Encounter (Signed)
Call in another 6 months

## 2012-01-08 ENCOUNTER — Other Ambulatory Visit: Payer: Self-pay | Admitting: Family Medicine

## 2012-01-08 NOTE — Telephone Encounter (Signed)
Can we refill this? 

## 2012-01-23 ENCOUNTER — Other Ambulatory Visit: Payer: Self-pay | Admitting: Family Medicine

## 2012-02-05 ENCOUNTER — Other Ambulatory Visit: Payer: Self-pay | Admitting: Family Medicine

## 2012-02-06 ENCOUNTER — Telehealth: Payer: Self-pay | Admitting: Family Medicine

## 2012-02-06 NOTE — Telephone Encounter (Signed)
Patient called stating that since last week her pharmacy [facility] [address] has been stating that she has no refills of her zolpidem and they state they have been requesting this since last week. Please assist and inform patient when done.

## 2012-02-06 NOTE — Telephone Encounter (Signed)
I called pharmacy and spoke with pt

## 2012-02-07 NOTE — Telephone Encounter (Signed)
Call in #30 with 5 rf 

## 2012-09-02 ENCOUNTER — Other Ambulatory Visit: Payer: Self-pay | Admitting: Family Medicine

## 2012-09-03 ENCOUNTER — Telehealth: Payer: Self-pay | Admitting: Family Medicine

## 2012-09-03 NOTE — Telephone Encounter (Signed)
Refill request for Ambien. I did speak with pt and explained that you were out of the office until 09/11/12. I advised that pt might want to schedule a office visit, it has been awhile since pt has been seen in office.

## 2012-09-03 NOTE — Telephone Encounter (Signed)
Pt called back again and did schedule a office visit to see Dr. Clent Ridges on 09/12/12. She would like enough of the Ambien to last until her appointment. Please call pt if and when we can do this?

## 2012-09-03 NOTE — Telephone Encounter (Signed)
No rx

## 2012-09-04 NOTE — Telephone Encounter (Signed)
I spoke with pt  

## 2012-09-10 ENCOUNTER — Other Ambulatory Visit: Payer: Self-pay | Admitting: Family Medicine

## 2012-09-12 ENCOUNTER — Ambulatory Visit (INDEPENDENT_AMBULATORY_CARE_PROVIDER_SITE_OTHER): Payer: 59 | Admitting: Family Medicine

## 2012-09-12 ENCOUNTER — Encounter: Payer: Self-pay | Admitting: Family Medicine

## 2012-09-12 VITALS — BP 102/60 | HR 94 | Temp 98.1°F | Wt 130.0 lb

## 2012-09-12 DIAGNOSIS — G4700 Insomnia, unspecified: Secondary | ICD-10-CM

## 2012-09-12 MED ORDER — ZOLPIDEM TARTRATE ER 6.25 MG PO TBCR: 6.2500 mg | EXTENDED_RELEASE_TABLET | Freq: Every evening | ORAL | Status: DC | PRN

## 2012-09-12 NOTE — Progress Notes (Signed)
  Subjective:    Patient ID: Cheryl Ritter, female    DOB: Mar 21, 1970, 42 y.o.   MRN: [id number]  HPI Here to discuss her insomnia. She has had good results with Ambien CR for several years now. She recently tried to stop it but could not sleep at all and felt miserable. She would like to get back on it. She has no side effects.    Review of Systems  Constitutional: Negative.   Neurological: Negative.   Psychiatric/Behavioral: Positive for sleep disturbance. Negative for behavioral problems, confusion, dysphoric mood, decreased concentration and agitation. The patient is not nervous/anxious.        Objective:   Physical Exam  Constitutional: She is oriented to person, place, and time. She appears well-developed and well-nourished.  Neurological: She is alert and oriented to person, place, and time.  Psychiatric: She has a normal mood and affect. Her behavior is normal. Thought content normal.          Assessment & Plan:  Refilled Ambien for 6 months

## 2012-10-14 ENCOUNTER — Telehealth: Payer: Self-pay | Admitting: Family Medicine

## 2012-10-14 MED ORDER — PERMETHRIN 5 % EX CREA: TOPICAL_CREAM | Freq: Once | CUTANEOUS | Status: DC

## 2012-10-14 NOTE — Telephone Encounter (Signed)
done

## 2012-11-04 ENCOUNTER — Other Ambulatory Visit: Payer: Self-pay | Admitting: Family Medicine

## 2012-11-04 NOTE — Telephone Encounter (Signed)
Call in 60 gm with one refill

## 2012-12-19 ENCOUNTER — Ambulatory Visit: Payer: 59

## 2013-03-05 ENCOUNTER — Other Ambulatory Visit: Payer: Self-pay | Admitting: Family Medicine

## 2013-03-06 ENCOUNTER — Other Ambulatory Visit: Payer: Self-pay

## 2013-03-06 DIAGNOSIS — Z1231 Encounter for screening mammogram for malignant neoplasm of breast: Secondary | ICD-10-CM

## 2013-03-06 NOTE — Telephone Encounter (Signed)
Call in #30 with 5 rf 

## 2013-03-06 NOTE — Telephone Encounter (Signed)
Pt is out °

## 2013-03-25 ENCOUNTER — Encounter: Payer: 59 | Admitting: Family Medicine

## 2013-03-31 ENCOUNTER — Ambulatory Visit
Admission: RE | Admit: 2013-03-31 | Discharge: 2013-03-31 | Disposition: A | Discharge status: Home / Self Care | Payer: Self-pay | Source: Ambulatory Visit

## 2013-03-31 DIAGNOSIS — Z1231 Encounter for screening mammogram for malignant neoplasm of breast: Secondary | ICD-10-CM

## 2013-04-01 ENCOUNTER — Other Ambulatory Visit: Payer: Self-pay | Admitting: Obstetrics and Gynecology

## 2013-04-01 DIAGNOSIS — R928 Other abnormal and inconclusive findings on diagnostic imaging of breast: Secondary | ICD-10-CM

## 2013-04-07 ENCOUNTER — Ambulatory Visit
Admission: RE | Admit: 2013-04-07 | Discharge: 2013-04-07 | Disposition: A | Discharge status: Home / Self Care | Payer: 59 | Source: Ambulatory Visit | Attending: Obstetrics and Gynecology | Admitting: Obstetrics and Gynecology

## 2013-04-07 DIAGNOSIS — R928 Other abnormal and inconclusive findings on diagnostic imaging of breast: Secondary | ICD-10-CM

## 2013-04-28 ENCOUNTER — Telehealth: Payer: Self-pay | Admitting: Family Medicine

## 2013-04-28 MED ORDER — AZITHROMYCIN 250 MG PO TABS: ORAL_TABLET | ORAL | Status: DC

## 2013-04-28 NOTE — Telephone Encounter (Signed)
done

## 2013-05-11 ENCOUNTER — Encounter: Payer: Self-pay | Admitting: *Deleted

## 2013-05-12 ENCOUNTER — Ambulatory Visit (INDEPENDENT_AMBULATORY_CARE_PROVIDER_SITE_OTHER): Payer: 59 | Admitting: Family Medicine

## 2013-05-12 ENCOUNTER — Encounter: Payer: Self-pay | Admitting: Family Medicine

## 2013-05-12 VITALS — BP 100/60 | HR 100 | Temp 98.8°F | Ht 68.0 in | Wt 132.0 lb

## 2013-05-12 DIAGNOSIS — Z0000 Encounter for general adult medical examination without abnormal findings: Secondary | ICD-10-CM

## 2013-05-12 MED ORDER — FUROSEMIDE 20 MG PO TABS: ORAL_TABLET | ORAL | Status: DC

## 2013-05-12 NOTE — Progress Notes (Signed)
Pre visit review using our clinic review tool, if applicable. No additional management support is needed unless otherwise documented below in the visit note. 

## 2013-05-12 NOTE — Progress Notes (Signed)
   Subjective:    Patient ID: Cheryl Ritter, female    DOB: [birth year], 43 y.o.   MRN: [id number]  HPI 43 yr old female for a cpx. She feels well.    Review of Systems  Constitutional: Negative.   HENT: Negative.   Eyes: Negative.   Respiratory: Negative.   Cardiovascular: Negative.   Gastrointestinal: Negative.   Genitourinary: Negative for dysuria, urgency, frequency, hematuria, flank pain, decreased urine volume, enuresis, difficulty urinating, pelvic pain and dyspareunia.  Musculoskeletal: Negative.   Skin: Negative.   Neurological: Negative.   Psychiatric/Behavioral: Negative.        Objective:   Physical Exam  Constitutional: She is oriented to person, place, and time. She appears well-developed and well-nourished. No distress.  HENT:  Head: Normocephalic and atraumatic.  Right Ear: External ear normal.  Left Ear: External ear normal.  Nose: Nose normal.  Mouth/Throat: Oropharynx is clear and moist. No oropharyngeal exudate.  Eyes: Conjunctivae and EOM are normal. Pupils are equal, round, and reactive to light. No scleral icterus.  Neck: Normal range of motion. Neck supple. No JVD present. No thyromegaly present.  Cardiovascular: Normal rate, regular rhythm, normal heart sounds and intact distal pulses.  Exam reveals no gallop and no friction rub.   No murmur heard. Pulmonary/Chest: Effort normal and breath sounds normal. No respiratory distress. She has no wheezes. She has no rales. She exhibits no tenderness.  Abdominal: Soft. Bowel sounds are normal. She exhibits no distension and no mass. There is no tenderness. There is no rebound and no guarding.  Musculoskeletal: Normal range of motion. She exhibits no edema and no tenderness.  Lymphadenopathy:    She has no cervical adenopathy.  Neurological: She is alert and oriented to person, place, and time. She has normal reflexes. No cranial nerve deficit. She exhibits normal muscle tone. Coordination normal.  Skin: Skin is  warm and dry. No rash noted. No erythema.  Psychiatric: She has a normal mood and affect. Her behavior is normal. Judgment and thought content normal.          Assessment & Plan:  Well exam. Set up fasting labs soon

## 2013-08-21 ENCOUNTER — Other Ambulatory Visit: Payer: Self-pay | Admitting: Family Medicine

## 2013-08-24 NOTE — Telephone Encounter (Signed)
Call in #30 with 5 rf 

## 2014-02-11 ENCOUNTER — Other Ambulatory Visit: Payer: Self-pay | Admitting: Family Medicine

## 2014-02-11 NOTE — Telephone Encounter (Signed)
Stop the Zolpidem and switch to Temazepam 15 mg qhs. Call in #30 with 5 rf

## 2014-02-16 MED ORDER — TEMAZEPAM 7.5 MG PO CAPS: 7.5000 mg | ORAL_CAPSULE | Freq: Every evening | ORAL | Status: DC | PRN

## 2014-02-16 NOTE — Telephone Encounter (Signed)
Per patient request, stop the Ambien and switch to Temazepam 7.5 mg to take qhs. Call in  #30 with 5 rf

## 2014-02-16 NOTE — Telephone Encounter (Signed)
I spoke with pt and called in script.

## 2014-06-10 ENCOUNTER — Other Ambulatory Visit: Payer: Self-pay | Admitting: Family Medicine

## 2014-08-09 ENCOUNTER — Other Ambulatory Visit: Payer: Self-pay | Admitting: Family Medicine

## 2014-08-12 NOTE — Telephone Encounter (Signed)
Refill for 6 months. 

## 2015-02-07 ENCOUNTER — Other Ambulatory Visit: Payer: Self-pay | Admitting: Family Medicine

## 2015-02-08 NOTE — Telephone Encounter (Signed)
Call in #30 with 2 rf. She needs an OV soon  

## 2015-05-09 ENCOUNTER — Other Ambulatory Visit: Payer: Self-pay

## 2015-05-09 DIAGNOSIS — Z1231 Encounter for screening mammogram for malignant neoplasm of breast: Secondary | ICD-10-CM

## 2015-05-16 ENCOUNTER — Other Ambulatory Visit: Payer: Self-pay | Admitting: Family Medicine

## 2015-05-17 NOTE — Telephone Encounter (Signed)
Call in #30 only. She needs an OV soon  

## 2015-05-18 ENCOUNTER — Other Ambulatory Visit: Payer: Self-pay

## 2015-05-18 DIAGNOSIS — Z9882 Breast implant status: Secondary | ICD-10-CM

## 2015-05-18 DIAGNOSIS — Z1231 Encounter for screening mammogram for malignant neoplasm of breast: Secondary | ICD-10-CM

## 2015-05-18 NOTE — Telephone Encounter (Signed)
Pt aware only 30, and pt has made an appointment [facility]

## 2015-05-31 ENCOUNTER — Ambulatory Visit (INDEPENDENT_AMBULATORY_CARE_PROVIDER_SITE_OTHER): Payer: 59 | Admitting: Family Medicine

## 2015-05-31 ENCOUNTER — Encounter: Payer: Self-pay | Admitting: Family Medicine

## 2015-05-31 VITALS — BP 101/66 | HR 84 | Temp 98.5°F | Ht 68.0 in | Wt 142.0 lb

## 2015-05-31 DIAGNOSIS — G4700 Insomnia, unspecified: Secondary | ICD-10-CM | POA: Diagnosis not present

## 2015-05-31 DIAGNOSIS — Z0000 Encounter for general adult medical examination without abnormal findings: Secondary | ICD-10-CM | POA: Diagnosis not present

## 2015-05-31 DIAGNOSIS — R635 Abnormal weight gain: Secondary | ICD-10-CM | POA: Diagnosis not present

## 2015-05-31 MED ORDER — TEMAZEPAM 7.5 MG PO CAPS: ORAL_CAPSULE | ORAL | Status: DC

## 2015-05-31 MED ORDER — PHENTERMINE HCL 37.5 MG PO CAPS: 37.5000 mg | ORAL_CAPSULE | ORAL | Status: DC

## 2015-05-31 NOTE — Progress Notes (Signed)
   Subjective:    Patient ID: Cheryl Ritter, female    DOB: 09/08/1970, 45 y.o.   MRN: [id number]  HPI Here to follow up. She has been taking Temazepam for sleep and this has been working well for her with no side effects. She would like refills. Also she asks about help with losing weight. She exercises and watches her diet but she has still put on a little weight over the past year.    Review of Systems  Constitutional: Negative.   Respiratory: Negative.   Cardiovascular: Negative.   Endocrine: Negative.   Neurological: Negative.   Psychiatric/Behavioral: Negative.        Objective:   Physical Exam  Constitutional: She appears well-developed and well-nourished.  Neck: No thyromegaly present.  Cardiovascular: Normal rate, regular rhythm, normal heart sounds and intact distal pulses.   Pulmonary/Chest: Effort normal and breath sounds normal.  Lymphadenopathy:    She has no cervical adenopathy.          Assessment & Plan:  Her insomnia is stable, so we refilled Temazepam. We discussed diet and exercise, and she will try Phentermine for 6 months. She will return soon for fasting labs.  Laurey Morale, MD

## 2015-05-31 NOTE — Progress Notes (Signed)
Pre visit review using our clinic review tool, if applicable. No additional management support is needed unless otherwise documented below in the visit note. 

## 2015-06-02 ENCOUNTER — Ambulatory Visit: Payer: Self-pay

## 2015-06-17 ENCOUNTER — Other Ambulatory Visit: Payer: Self-pay | Admitting: Family Medicine

## 2015-07-18 ENCOUNTER — Ambulatory Visit
Admission: RE | Admit: 2015-07-18 | Discharge: 2015-07-18 | Disposition: A | Discharge status: Home / Self Care | Payer: 59 | Source: Ambulatory Visit

## 2015-07-18 DIAGNOSIS — Z1231 Encounter for screening mammogram for malignant neoplasm of breast: Secondary | ICD-10-CM

## 2015-07-18 DIAGNOSIS — Z9882 Breast implant status: Secondary | ICD-10-CM

## 2015-09-16 ENCOUNTER — Telehealth: Payer: Self-pay | Admitting: Family Medicine

## 2015-09-16 MED ORDER — DOXYCYCLINE HYCLATE 100 MG PO CAPS: 100.0000 mg | ORAL_CAPSULE | Freq: Two times a day (BID) | ORAL | Status: AC

## 2015-09-16 NOTE — Telephone Encounter (Signed)
done

## 2015-12-15 ENCOUNTER — Other Ambulatory Visit: Payer: Self-pay | Admitting: Family Medicine

## 2015-12-16 NOTE — Telephone Encounter (Signed)
Call in #30 with 5 rf 

## 2016-03-27 ENCOUNTER — Ambulatory Visit: Payer: 59

## 2016-06-13 ENCOUNTER — Other Ambulatory Visit: Payer: Self-pay | Admitting: Family Medicine

## 2016-06-14 NOTE — Telephone Encounter (Signed)
Call in #30 with 2 rf. She needs an OV soon

## 2016-09-26 ENCOUNTER — Ambulatory Visit: Payer: 59 | Admitting: Family Medicine

## 2017-01-30 DIAGNOSIS — L820 Inflamed seborrheic keratosis: Secondary | ICD-10-CM | POA: Diagnosis not present

## 2017-03-27 ENCOUNTER — Other Ambulatory Visit: Payer: Self-pay | Admitting: Obstetrics and Gynecology

## 2017-03-27 DIAGNOSIS — Z139 Encounter for screening, unspecified: Secondary | ICD-10-CM

## 2017-04-12 ENCOUNTER — Ambulatory Visit
Admission: RE | Admit: 2017-04-12 | Discharge: 2017-04-12 | Disposition: A | Discharge status: Home / Self Care | Payer: BLUE CROSS/BLUE SHIELD | Source: Ambulatory Visit | Attending: Obstetrics and Gynecology | Admitting: Obstetrics and Gynecology

## 2017-04-12 DIAGNOSIS — Z139 Encounter for screening, unspecified: Secondary | ICD-10-CM

## 2017-04-12 DIAGNOSIS — Z1231 Encounter for screening mammogram for malignant neoplasm of breast: Secondary | ICD-10-CM | POA: Diagnosis not present

## 2017-05-08 DIAGNOSIS — R635 Abnormal weight gain: Secondary | ICD-10-CM | POA: Diagnosis not present

## 2017-05-08 DIAGNOSIS — N951 Menopausal and female climacteric states: Secondary | ICD-10-CM | POA: Diagnosis not present

## 2017-05-10 DIAGNOSIS — R232 Flushing: Secondary | ICD-10-CM | POA: Diagnosis not present

## 2017-05-10 DIAGNOSIS — R6882 Decreased libido: Secondary | ICD-10-CM | POA: Diagnosis not present

## 2017-05-10 DIAGNOSIS — Z1331 Encounter for screening for depression: Secondary | ICD-10-CM | POA: Diagnosis not present

## 2017-05-10 DIAGNOSIS — Z1339 Encounter for screening examination for other mental health and behavioral disorders: Secondary | ICD-10-CM | POA: Diagnosis not present

## 2017-05-10 DIAGNOSIS — R635 Abnormal weight gain: Secondary | ICD-10-CM | POA: Diagnosis not present

## 2017-05-10 DIAGNOSIS — R5383 Other fatigue: Secondary | ICD-10-CM | POA: Diagnosis not present

## 2017-07-16 DIAGNOSIS — L718 Other rosacea: Secondary | ICD-10-CM | POA: Diagnosis not present

## 2017-08-22 ENCOUNTER — Ambulatory Visit: Payer: Self-pay

## 2017-08-22 NOTE — Telephone Encounter (Signed)
Patient called in with c/o "head, neck pain." She says "I fell and hit my head in December and I had a knot. I don't know if this headache is related, but it is on the back of my head to the right side to my right ear and the headache is not constant and not every day. I also have neck pain and right shoulder pain. The pain is a 5. I don't have any other symptoms." According to protocol, see PCP within 2 weeks, appointment scheduled for tomorrow at 1415 with Dr. Sarajane Jews, care advice given, patient verbalized understanding.  Reason for Disposition . Headache is a chronic symptom (recurrent or ongoing AND present > 4 weeks)  Answer Assessment - Initial Assessment Questions 1. LOCATION: "Where does it hurt?"      Back of head to right side to right ear 2. ONSET: "When did the headache start?" (Minutes, hours or days)      Maybe 4-5 months 3. PATTERN: "Does the pain come and go, or has it been constant since it started?"     Come and go 4. SEVERITY: "How bad is the pain?" and "What does it keep you from doing?"  (e.g., Scale 1-10; mild, moderate, or severe)   - MILD (1-3): doesn't interfere with normal activities    - MODERATE (4-7): interferes with normal activities or awakens from sleep    - SEVERE (8-10): excruciating pain, unable to do any normal activities        5 5. RECURRENT SYMPTOM: "Have you ever had headaches before?" If so, ask: "When was the last time?" and "What happened that time?"     No 6. CAUSE: "What do you think is causing the headache?"     I don't know 7. MIGRAINE: "Have you been diagnosed with migraine headaches?" If so, ask: "Is this headache similar?"      No 8. HEAD INJURY: "Has there been any recent injury to the head?"      No, fell in December 9. OTHER SYMPTOMS: "Do you have any other symptoms?" (fever, stiff neck, eye pain, sore throat, cold symptoms)     Neck pain, right shoulder pain  10. PREGNANCY: "Is there any chance you are pregnant?" "When was your last  menstrual period?"       No  Protocols used: HEADACHE-A-AH

## 2017-08-23 ENCOUNTER — Encounter: Payer: Self-pay | Admitting: Family Medicine

## 2017-08-23 ENCOUNTER — Ambulatory Visit: Payer: BLUE CROSS/BLUE SHIELD | Admitting: Family Medicine

## 2017-08-23 VITALS — BP 114/68 | HR 81 | Temp 97.7°F | Ht 68.0 in | Wt 153.2 lb

## 2017-08-23 DIAGNOSIS — G44329 Chronic post-traumatic headache, not intractable: Secondary | ICD-10-CM | POA: Diagnosis not present

## 2017-08-23 DIAGNOSIS — M542 Cervicalgia: Secondary | ICD-10-CM

## 2017-08-23 DIAGNOSIS — G8929 Other chronic pain: Secondary | ICD-10-CM | POA: Diagnosis not present

## 2017-08-23 MED ORDER — FUROSEMIDE 20 MG PO TABS: ORAL_TABLET | ORAL | 3 refills | Status: DC

## 2017-08-23 NOTE — Progress Notes (Signed)
Subjective:    Patient ID: Cheryl Ritter, female    DOB: 1970/03/31, 47 y.o.   MRN: [id number]  HPI Here to discuss intermittent right posterior neck pain and right shoulder pain. This all started after she had 2 falls on the same night in January 2019. She was out with friends at a bar and she says she had consumed only 2 glasses of wine when she apparently briefly blacked out and fell off of the bar stool she was sitting on. She landed on the floor and seemed to recover quickly. She then went home with her husband and as they were walking across the garage she again apparently blacked out and fell to the floor. She again recovered quickly although she had bumped the back of her head on the concrete floor. She has never had any more syncopal episodes since then and she has had no neurologic symptoms at all since then other than some mild posterior headaches. No blurred vision or weakness or dizziness. She has had some mild intermittent pains and stiffness however in the right posterior neck that radiate up the right side of the back of the head to above the ear. She also has pains that radiate from the neck over to the top of the right shoulder. Her ROM has been fine. She has taken Ibuprofen for this a few times but she usually just tolerates it and it goes away in a few hours.   Review of Systems  Constitutional: Negative.   Respiratory: Negative.   Cardiovascular: Negative.   Musculoskeletal: Positive for arthralgias, neck pain and neck stiffness.  Neurological: Positive for syncope. Negative for dizziness, tremors, seizures, facial asymmetry, speech difficulty, weakness, light-headedness, numbness and headaches.       Objective:   Physical Exam  Constitutional: She is oriented to person, place, and time. She appears well-developed and well-nourished. No distress.  HENT:  Head: Normocephalic and atraumatic.  Right Ear: External ear normal.  Left Ear: External ear normal.  Nose: Nose normal.   Mouth/Throat: Oropharynx is clear and moist.  Eyes: Pupils are equal, round, and reactive to light. Conjunctivae and EOM are normal.  Neck: Normal range of motion. Neck supple. No thyromegaly present.  No tenderness in the neck or scalp at all   Cardiovascular: Normal rate, regular rhythm, normal heart sounds and intact distal pulses.  Pulmonary/Chest: Effort normal and breath sounds normal.  Musculoskeletal:  She is mildly tender in the right upper back and in the upper right trapezius, the shoulder is intact and has full ROM   Lymphadenopathy:    She has no cervical adenopathy.  Neurological: She is alert and oriented to person, place, and time. No cranial nerve deficit. She exhibits normal muscle tone. Coordination normal.          Assessment & Plan:  She has chronic pains in the neck and right shoulder areas which likely are the result of 2 falls in January. She does not want to take any medication for these and she asks if taking hot yoga classes will help. She has found these to be helpful for muscular pains in the past. The 2 syncopal episodes she had in January were likely vasovagal, and she was probably dehydrated that night. She has had no neurologic problems since then but she is very concerned about whether her current pains are related. We will set up non-contrasted CT scans of the head and cervical spine for next week. I agreed that hot yoga would probably help  her pains quite a bit but she will hold off on this until after her scans are taken.  Alysia Penna, MD

## 2017-08-27 DIAGNOSIS — L718 Other rosacea: Secondary | ICD-10-CM | POA: Diagnosis not present

## 2017-08-27 DIAGNOSIS — D485 Neoplasm of uncertain behavior of skin: Secondary | ICD-10-CM | POA: Diagnosis not present

## 2017-08-27 DIAGNOSIS — L299 Pruritus, unspecified: Secondary | ICD-10-CM | POA: Diagnosis not present

## 2017-12-11 DIAGNOSIS — N898 Other specified noninflammatory disorders of vagina: Secondary | ICD-10-CM | POA: Diagnosis not present

## 2017-12-11 DIAGNOSIS — Z01419 Encounter for gynecological examination (general) (routine) without abnormal findings: Secondary | ICD-10-CM | POA: Diagnosis not present

## 2018-02-05 ENCOUNTER — Encounter: Payer: Self-pay | Admitting: Family Medicine

## 2018-02-05 ENCOUNTER — Ambulatory Visit: Payer: BLUE CROSS/BLUE SHIELD | Admitting: Family Medicine

## 2018-02-05 VITALS — BP 106/72 | HR 78 | Temp 98.1°F | Wt 155.3 lb

## 2018-02-05 DIAGNOSIS — J019 Acute sinusitis, unspecified: Secondary | ICD-10-CM | POA: Diagnosis not present

## 2018-02-05 MED ORDER — AZITHROMYCIN 250 MG PO TABS: ORAL_TABLET | ORAL | 0 refills | Status: DC

## 2018-02-05 NOTE — Progress Notes (Signed)
   Subjective:    Patient ID: Cheryl Ritter, female    DOB: 10/21/1970, 47 y.o.   MRN: [id number]  HPI Here for 6 days of sinus pressure, PND, and blowing green mucus from the nose. No cough. She has had some fever. Using Advil.    Review of Systems  Constitutional: Positive for fever.  HENT: Positive for congestion, sinus pressure and sinus pain. Negative for ear pain and sore throat.   Eyes: Negative.   Respiratory: Negative.        Objective:   Physical Exam  Constitutional: She appears well-developed and well-nourished.  HENT:  Right Ear: External ear normal.  Left Ear: External ear normal.  Nose: Nose normal.  Mouth/Throat: Oropharynx is clear and moist.  Eyes: Conjunctivae are normal.  Neck: No thyromegaly present.  Pulmonary/Chest: Effort normal and breath sounds normal. No stridor. No respiratory distress. She has no wheezes. She has no rales.          Assessment & Plan:  Sinusitis, treat with a Zpack. Add Mucinex.  Alysia Penna, MD

## 2018-03-18 DIAGNOSIS — L570 Actinic keratosis: Secondary | ICD-10-CM | POA: Diagnosis not present

## 2018-03-18 DIAGNOSIS — L718 Other rosacea: Secondary | ICD-10-CM | POA: Diagnosis not present

## 2018-03-18 DIAGNOSIS — B079 Viral wart, unspecified: Secondary | ICD-10-CM | POA: Diagnosis not present

## 2018-03-18 DIAGNOSIS — D485 Neoplasm of uncertain behavior of skin: Secondary | ICD-10-CM | POA: Diagnosis not present

## 2018-03-26 DIAGNOSIS — L570 Actinic keratosis: Secondary | ICD-10-CM | POA: Diagnosis not present

## 2018-03-26 DIAGNOSIS — L218 Other seborrheic dermatitis: Secondary | ICD-10-CM | POA: Diagnosis not present

## 2018-05-01 ENCOUNTER — Telehealth: Payer: Self-pay

## 2018-05-01 NOTE — Telephone Encounter (Signed)
Copied from [place] [id number]. Topic: General - Other >> May 01, 2018  8:46 AM Antonieta Iba C wrote: Reason for CRM: pt would like to have an colonoscopy. Pt would like to have further assistance with getting this. Pt doesn't have a location in mind.   Please assist.   CB: [phone]

## 2018-05-01 NOTE — Telephone Encounter (Signed)
Dr. Fry please advise. Thanks  

## 2018-05-02 NOTE — Telephone Encounter (Signed)
[id number] x1 

## 2018-05-02 NOTE — Telephone Encounter (Signed)
Her insurance likely will not cover this until she  Is 33 (which will be in July). Have her call back then and I will set it up

## 2018-05-05 DIAGNOSIS — H524 Presbyopia: Secondary | ICD-10-CM | POA: Diagnosis not present

## 2018-05-05 DIAGNOSIS — H04123 Dry eye syndrome of bilateral lacrimal glands: Secondary | ICD-10-CM | POA: Diagnosis not present

## 2018-05-05 DIAGNOSIS — H01009 Unspecified blepharitis unspecified eye, unspecified eyelid: Secondary | ICD-10-CM | POA: Diagnosis not present

## 2018-07-22 ENCOUNTER — Other Ambulatory Visit: Payer: Self-pay | Admitting: Obstetrics and Gynecology

## 2018-07-22 DIAGNOSIS — Z1231 Encounter for screening mammogram for malignant neoplasm of breast: Secondary | ICD-10-CM

## 2018-08-23 ENCOUNTER — Ambulatory Visit
Admission: RE | Admit: 2018-08-23 | Discharge: 2018-08-23 | Disposition: A | Discharge status: Home / Self Care | Payer: BLUE CROSS/BLUE SHIELD | Source: Ambulatory Visit | Attending: Obstetrics and Gynecology | Admitting: Obstetrics and Gynecology

## 2018-08-23 ENCOUNTER — Other Ambulatory Visit: Payer: Self-pay

## 2018-08-23 DIAGNOSIS — Z1231 Encounter for screening mammogram for malignant neoplasm of breast: Secondary | ICD-10-CM

## 2018-08-25 ENCOUNTER — Other Ambulatory Visit: Payer: Self-pay | Admitting: Family Medicine

## 2018-09-29 DIAGNOSIS — R6882 Decreased libido: Secondary | ICD-10-CM | POA: Diagnosis not present

## 2018-09-29 DIAGNOSIS — R5383 Other fatigue: Secondary | ICD-10-CM | POA: Diagnosis not present

## 2018-09-29 DIAGNOSIS — N951 Menopausal and female climacteric states: Secondary | ICD-10-CM | POA: Diagnosis not present

## 2018-10-27 DIAGNOSIS — L570 Actinic keratosis: Secondary | ICD-10-CM | POA: Diagnosis not present

## 2018-10-27 DIAGNOSIS — L819 Disorder of pigmentation, unspecified: Secondary | ICD-10-CM | POA: Diagnosis not present

## 2018-10-27 DIAGNOSIS — D485 Neoplasm of uncertain behavior of skin: Secondary | ICD-10-CM | POA: Diagnosis not present

## 2018-10-28 DIAGNOSIS — L821 Other seborrheic keratosis: Secondary | ICD-10-CM | POA: Diagnosis not present

## 2019-01-12 DIAGNOSIS — R5383 Other fatigue: Secondary | ICD-10-CM | POA: Diagnosis not present

## 2019-01-12 DIAGNOSIS — R6882 Decreased libido: Secondary | ICD-10-CM | POA: Diagnosis not present

## 2019-01-12 DIAGNOSIS — N951 Menopausal and female climacteric states: Secondary | ICD-10-CM | POA: Diagnosis not present

## 2019-01-21 DIAGNOSIS — N951 Menopausal and female climacteric states: Secondary | ICD-10-CM | POA: Diagnosis not present

## 2019-01-21 DIAGNOSIS — R6882 Decreased libido: Secondary | ICD-10-CM | POA: Diagnosis not present

## 2019-01-21 DIAGNOSIS — R232 Flushing: Secondary | ICD-10-CM | POA: Diagnosis not present

## 2019-05-25 ENCOUNTER — Telehealth: Payer: Self-pay | Admitting: Family Medicine

## 2019-05-25 DIAGNOSIS — Z0000 Encounter for general adult medical examination without abnormal findings: Secondary | ICD-10-CM

## 2019-05-25 NOTE — Telephone Encounter (Signed)
The patient is wanting to schedule a colonoscopy.  Please advise

## 2019-05-26 ENCOUNTER — Encounter: Payer: Self-pay | Admitting: Gastroenterology

## 2019-05-26 NOTE — Telephone Encounter (Signed)
Ok for order?  

## 2019-05-26 NOTE — Telephone Encounter (Signed)
I did the referral 

## 2019-05-26 NOTE — Addendum Note (Signed)
Addended by: Alysia Penna A on: 05/26/2019 09:41 AM   Modules accepted: Orders

## 2019-06-29 ENCOUNTER — Encounter: Payer: BC Managed Care – PPO | Admitting: Gastroenterology

## 2019-07-07 ENCOUNTER — Ambulatory Visit (AMBULATORY_SURGERY_CENTER): Payer: Self-pay | Admitting: *Deleted

## 2019-07-07 ENCOUNTER — Other Ambulatory Visit: Payer: Self-pay

## 2019-07-07 VITALS — Temp 97.5°F | Ht 67.5 in | Wt 142.0 lb

## 2019-07-07 DIAGNOSIS — Z1211 Encounter for screening for malignant neoplasm of colon: Secondary | ICD-10-CM

## 2019-07-07 MED ORDER — NA SULFATE-K SULFATE-MG SULF 17.5-3.13-1.6 GM/177ML PO SOLN: 1.0000 | Freq: Once | ORAL | 0 refills | Status: AC

## 2019-07-07 NOTE — Progress Notes (Signed)

## 2019-07-08 ENCOUNTER — Telehealth: Payer: Self-pay | Admitting: Gastroenterology

## 2019-07-08 NOTE — Telephone Encounter (Signed)
Pt had questions about prep coverage- I spoke with pharmacy who states prep will be $60.00.  I asked them to run the coupon and she states it actually increased priced.  I spoke with pt and she will also take 15% off coupon off to pharmacy

## 2019-07-14 ENCOUNTER — Encounter: Payer: Self-pay | Admitting: Gastroenterology

## 2019-07-21 ENCOUNTER — Encounter: Payer: Self-pay | Admitting: Gastroenterology

## 2019-07-21 ENCOUNTER — Ambulatory Visit (AMBULATORY_SURGERY_CENTER): Payer: BC Managed Care – PPO | Admitting: Gastroenterology

## 2019-07-21 ENCOUNTER — Other Ambulatory Visit: Payer: Self-pay

## 2019-07-21 VITALS — BP 104/64 | HR 71 | Temp 96.6°F | Resp 17 | Ht 68.0 in | Wt 140.0 lb

## 2019-07-21 DIAGNOSIS — Z1211 Encounter for screening for malignant neoplasm of colon: Secondary | ICD-10-CM | POA: Diagnosis present

## 2019-07-21 DIAGNOSIS — D122 Benign neoplasm of ascending colon: Secondary | ICD-10-CM | POA: Diagnosis not present

## 2019-07-21 MED ORDER — SODIUM CHLORIDE 0.9 % IV SOLN: 500.0000 mL | Freq: Once | INTRAVENOUS | Status: DC

## 2019-07-21 NOTE — Patient Instructions (Signed)
HANDOUTS PROVIDED ON: POLYPS & HEMORRHOIDS  The polyp removed today have been sent for pathology.  The results can take 1-3 weeks to receive.  When your next colonoscopy should occur will be based on the pathology results.    You may resume your previous diet and medication schedule.  Thank you for allowing us to care for you today!!!   YOU HAD AN ENDOSCOPIC PROCEDURE TODAY AT THE [facility]:   Refer to the procedure report that was given to you for any specific questions about what was found during the examination.  If the procedure report does not answer your questions, please call your gastroenterologist to clarify.  If you requested that your care partner not be given the details of your procedure findings, then the procedure report has been included in a sealed envelope for you to review at your convenience later.  YOU SHOULD EXPECT: Some feelings of bloating in the abdomen. Passage of more gas than usual.  Walking can help get rid of the air that was put into your GI tract during the procedure and reduce the bloating. If you had a lower endoscopy (such as a colonoscopy or flexible sigmoidoscopy) you may notice spotting of blood in your stool or on the toilet paper. If you underwent a bowel prep for your procedure, you may not have a normal bowel movement for a few days.  Please Note:  You might notice some irritation and congestion in your nose or some drainage.  This is from the oxygen used during your procedure.  There is no need for concern and it should clear up in a day or so.  SYMPTOMS TO REPORT IMMEDIATELY:   Following lower endoscopy (colonoscopy or flexible sigmoidoscopy):  Excessive amounts of blood in the stool  Significant tenderness or worsening of abdominal pains  Swelling of the abdomen that is new, acute  Fever of 100F or higher  For urgent or emergent issues, a gastroenterologist can be reached at any hour by calling [phone]. Do not use MyChart  messaging for urgent concerns.    DIET:  We do recommend a small meal at first, but then you may proceed to your regular diet.  Drink plenty of fluids but you should avoid alcoholic beverages for 24 hours.  ACTIVITY:  You should plan to take it easy for the rest of today and you should NOT DRIVE or use heavy machinery until tomorrow (because of the sedation medicines used during the test).    FOLLOW UP: Our staff will call the number listed on your records 48-72 hours following your procedure to check on you and address any questions or concerns that you may have regarding the information given to you following your procedure. If we do not reach you, we will leave a message.  We will attempt to reach you two times.  During this call, we will ask if you have developed any symptoms of COVID 19. If you develop any symptoms (ie: fever, flu-like symptoms, shortness of breath, cough etc.) before then, please call [phone].  If you test positive for Covid 19 in the 2 weeks post procedure, please call and report this information to us.    If any biopsies were taken you will be contacted by phone or by letter within the next 1-3 weeks.  Please call us at [phone] if you have not heard about the biopsies in 3 weeks.    SIGNATURES/CONFIDENTIALITY: You and/or your care partner have signed paperwork which will be   entered into your electronic medical record.  These signatures attest to the fact that that the information above on your After Visit Summary has been reviewed and is understood.  Full responsibility of the confidentiality of this discharge information lies with you and/or your care-partner. 

## 2019-07-21 NOTE — Progress Notes (Signed)
Report given to PACU, vss 

## 2019-07-21 NOTE — Progress Notes (Signed)
Vitals-CW  Pt's states no medical or surgical changes since previsit or office visit. 

## 2019-07-21 NOTE — Op Note (Signed)
[facility] Patient Name: Cheryl Ritter Procedure Date: 07/21/2019 10:21 AM MRN: [id number] Endoscopist: Mauri Pole , MD Age: 49 Referring MD:  Date of Birth: 18-Apr-1970 Gender: Female Account #: [id number] Procedure:                Colonoscopy Indications:              Screening for colorectal malignant neoplasm Medicines:                Monitored Anesthesia Care Procedure:                Pre-Anesthesia Assessment:                           - Prior to the procedure, a History and Physical                            was performed, and patient medications and                            allergies were reviewed. The patient's tolerance of                            previous anesthesia was also reviewed. The risks                            and benefits of the procedure and the sedation                            options and risks were discussed with the patient.                            All questions were answered, and informed consent                            was obtained. Prior Anticoagulants: The patient has                            taken no previous anticoagulant or antiplatelet                            agents. ASA Grade Assessment: II - A patient with                            mild systemic disease. After reviewing the risks                            and benefits, the patient was deemed in                            satisfactory condition to undergo the procedure.                           After obtaining informed consent, the colonoscope  was passed under direct vision. Throughout the                            procedure, the patient's blood pressure, pulse, and                            oxygen saturations were monitored continuously. The                            Colonoscope was introduced through the anus and                            advanced to the the cecum, identified by                            appendiceal orifice and  ileocecal valve. The                            colonoscopy was performed without difficulty. The                            patient tolerated the procedure well. The quality                            of the bowel preparation was excellent. The                            ileocecal valve, appendiceal orifice, and rectum                            were photographed. Scope In: 10:39:08 AM Scope Out: 10:58:23 AM Scope Withdrawal Time: 0 hours 13 minutes 55 seconds  Total Procedure Duration: 0 hours 19 minutes 15 seconds  Findings:                 The perianal and digital rectal examinations were                            normal.                           A 12 mm polyp was found in the ascending colon. The                            polyp was semi-pedunculated. The polyp was removed                            with a hot snare. Resection and retrieval were                            complete.                           Non-bleeding internal hemorrhoids were found during  retroflexion. The hemorrhoids were small.                           The exam was otherwise without abnormality. Complications:            No immediate complications. Estimated Blood Loss:     Estimated blood loss was minimal. Impression:               - One 12 mm polyp in the ascending colon, removed                            with a hot snare. Resected and retrieved.                           - Non-bleeding internal hemorrhoids.                           - The examination was otherwise normal. Recommendation:           - Patient has a contact number available for                            emergencies. The signs and symptoms of potential                            delayed complications were discussed with the                            patient. Return to normal activities tomorrow.                            Written discharge instructions were provided to the                            patient.                            - Resume previous diet.                           - Continue present medications.                           - Await pathology results.                           - Repeat colonoscopy in 3 years for surveillance                            based on pathology results. Mauri Pole, MD 07/21/2019 11:02:42 AM This report has been signed electronically.

## 2019-07-21 NOTE — Progress Notes (Signed)
Called to room to assist during endoscopic procedure.  Patient ID and intended procedure confirmed with present staff. Received instructions for my participation in the procedure from the performing physician.  

## 2019-07-23 ENCOUNTER — Telehealth: Payer: Self-pay

## 2019-07-23 NOTE — Telephone Encounter (Signed)
LVM

## 2019-07-23 NOTE — Telephone Encounter (Signed)
Left message on follow up call. 

## 2019-07-30 ENCOUNTER — Encounter: Payer: Self-pay | Admitting: Gastroenterology

## 2019-10-28 ENCOUNTER — Other Ambulatory Visit: Payer: Self-pay | Admitting: Obstetrics and Gynecology

## 2019-10-28 DIAGNOSIS — Z1231 Encounter for screening mammogram for malignant neoplasm of breast: Secondary | ICD-10-CM

## 2019-11-12 ENCOUNTER — Other Ambulatory Visit: Payer: Self-pay

## 2019-11-12 ENCOUNTER — Ambulatory Visit
Admission: RE | Admit: 2019-11-12 | Discharge: 2019-11-12 | Disposition: A | Discharge status: Home / Self Care | Payer: BC Managed Care – PPO | Source: Ambulatory Visit | Attending: Obstetrics and Gynecology | Admitting: Obstetrics and Gynecology

## 2019-11-12 DIAGNOSIS — Z1231 Encounter for screening mammogram for malignant neoplasm of breast: Secondary | ICD-10-CM

## 2020-05-27 ENCOUNTER — Ambulatory Visit: Payer: BC Managed Care – PPO | Admitting: Family Medicine

## 2020-05-27 ENCOUNTER — Other Ambulatory Visit: Payer: Self-pay

## 2020-05-27 ENCOUNTER — Encounter: Payer: Self-pay | Admitting: Family Medicine

## 2020-05-27 VITALS — BP 108/76 | HR 68 | Temp 98.3°F | Wt 153.0 lb

## 2020-05-27 DIAGNOSIS — R222 Localized swelling, mass and lump, trunk: Secondary | ICD-10-CM

## 2020-05-27 LAB — BASIC METABOLIC PANEL
BUN: 12 mg/dL (ref 6–23)
CO2: 29 mEq/L (ref 19–32)
Calcium: 9.9 mg/dL (ref 8.4–10.5)
Chloride: 101 mEq/L (ref 96–112)
Creatinine, Ser: 0.79 mg/dL (ref 0.40–1.20)
GFR: 86.43 mL/min (ref >60.00)
Glucose, Bld: 102 mg/dL — ABNORMAL HIGH (ref 70–99)
Potassium: 3.8 mEq/L (ref 3.5–5.1)
Sodium: 141 mEq/L (ref 135–145)

## 2020-05-27 NOTE — Progress Notes (Signed)
   Subjective:    Patient ID: Cheryl Ritter, female    DOB: 1970-09-16, 50 y.o.   MRN: [id number]  HPI Here for a lump in the abdomen that she first noticed about 4-5 months ago. It has not been changing or growing but she has been concerned about. She feels fine in general, and she has been doing yoga. There is no pain. No bowel or bladder issues. She had a normal GYN ex last November. Of note she has had 2 low transverse C sections in this same area.   Review of Systems  Constitutional: Negative.   Respiratory: Negative.   Cardiovascular: Negative.   Gastrointestinal: Positive for abdominal distention.  Genitourinary: Negative.        Objective:   Physical Exam Constitutional:      Appearance: Normal appearance.  Cardiovascular:     Rate and Rhythm: Normal rate and regular rhythm.     Pulses: Normal pulses.     Heart sounds: Normal heart sounds.  Pulmonary:     Effort: Pulmonary effort is normal.     Breath sounds: Normal breath sounds.  Abdominal:     General: Abdomen is flat. Bowel sounds are normal. There is no distension.     Palpations: Abdomen is soft.     Tenderness: There is no abdominal tenderness. There is no guarding or rebound.     Hernia: No hernia is present.     Comments: There is a 2 cm firm non-tender non-mobile mass attached to the rectus muscles in the midline about 2 cm superior to the C section scar. No hernias felt   Neurological:     Mental Status: She is alert.           Assessment & Plan:  Abdominal wall mass, likely scar tissue from her surgeries. We will evaluate with a contrasted CT of the abdomen and pelvis.  Alysia Penna, MD

## 2020-06-22 ENCOUNTER — Ambulatory Visit
Admission: RE | Admit: 2020-06-22 | Discharge: 2020-06-22 | Disposition: A | Discharge status: Home / Self Care | Payer: BC Managed Care – PPO | Source: Ambulatory Visit | Attending: Family Medicine | Admitting: Family Medicine

## 2020-06-22 DIAGNOSIS — R222 Localized swelling, mass and lump, trunk: Secondary | ICD-10-CM

## 2020-06-22 MED ORDER — IOPAMIDOL (ISOVUE-300) INJECTION 61%
100.0000 mL | Freq: Once | INTRAVENOUS | Status: AC | PRN
  Administered 2020-06-22: 100 mL via INTRAVENOUS

## 2020-12-12 ENCOUNTER — Other Ambulatory Visit: Payer: Self-pay | Admitting: Obstetrics and Gynecology

## 2020-12-12 DIAGNOSIS — Z1231 Encounter for screening mammogram for malignant neoplasm of breast: Secondary | ICD-10-CM

## 2021-01-10 ENCOUNTER — Other Ambulatory Visit: Payer: Self-pay

## 2021-01-10 ENCOUNTER — Ambulatory Visit
Admission: RE | Admit: 2021-01-10 | Discharge: 2021-01-10 | Disposition: A | Discharge status: Home / Self Care | Payer: BC Managed Care – PPO | Source: Ambulatory Visit | Attending: Obstetrics and Gynecology | Admitting: Obstetrics and Gynecology

## 2021-01-10 DIAGNOSIS — Z1231 Encounter for screening mammogram for malignant neoplasm of breast: Secondary | ICD-10-CM

## 2021-07-11 ENCOUNTER — Ambulatory Visit (INDEPENDENT_AMBULATORY_CARE_PROVIDER_SITE_OTHER): Payer: 59 | Admitting: Family Medicine

## 2021-07-11 ENCOUNTER — Encounter: Payer: Self-pay | Admitting: Family Medicine

## 2021-07-11 VITALS — BP 110/76 | HR 66 | Temp 98.4°F | Wt 153.0 lb

## 2021-07-11 DIAGNOSIS — R635 Abnormal weight gain: Secondary | ICD-10-CM

## 2021-07-11 LAB — CBC WITH DIFFERENTIAL/PLATELET
Basophils Absolute: 0 10*3/uL (ref 0.0–0.1)
Basophils Relative: 0.5 % (ref 0.0–3.0)
Eosinophils Absolute: 0.2 10*3/uL (ref 0.0–0.7)
Eosinophils Relative: 3.2 % (ref 0.0–5.0)
HCT: 41.8 % (ref 36.0–46.0)
Hemoglobin: 14.3 g/dL (ref 12.0–15.0)
Lymphocytes Relative: 34.7 % (ref 12.0–46.0)
Lymphs Abs: 2.1 10*3/uL (ref 0.7–4.0)
MCHC: 34.1 g/dL (ref 30.0–36.0)
MCV: 101.6 fl — ABNORMAL HIGH (ref 78.0–100.0)
Monocytes Absolute: 0.5 10*3/uL (ref 0.1–1.0)
Monocytes Relative: 7.9 % (ref 3.0–12.0)
Neutro Abs: 3.3 10*3/uL (ref 1.4–7.7)
Neutrophils Relative %: 53.7 % (ref 43.0–77.0)
Platelets: 253 10*3/uL (ref 150.0–400.0)
RBC: 4.12 Mil/uL (ref 3.87–5.11)
RDW: 13.5 % (ref 11.5–15.5)
WBC: 6.1 10*3/uL (ref 4.0–10.5)

## 2021-07-11 LAB — BASIC METABOLIC PANEL
BUN: 12 mg/dL (ref 6–23)
CO2: 27 mEq/L (ref 19–32)
Calcium: 9.9 mg/dL (ref 8.4–10.5)
Chloride: 103 mEq/L (ref 96–112)
Creatinine, Ser: 0.8 mg/dL (ref 0.40–1.20)
GFR: 84.47 mL/min (ref >60.00)
Glucose, Bld: 84 mg/dL (ref 70–99)
Potassium: 4.5 mEq/L (ref 3.5–5.1)
Sodium: 140 mEq/L (ref 135–145)

## 2021-07-11 LAB — LIPID PANEL
Cholesterol: 193 mg/dL (ref 0–200)
HDL: 98.3 mg/dL (ref >39.00)
LDL Cholesterol: 75 mg/dL (ref 0–99)
NonHDL: 95.14
Total CHOL/HDL Ratio: 2
Triglycerides: 101 mg/dL (ref 0.0–149.0)
VLDL: 20.2 mg/dL (ref 0.0–40.0)

## 2021-07-11 LAB — HEPATIC FUNCTION PANEL
ALT: 18 U/L (ref 0–35)
AST: 26 U/L (ref 0–37)
Albumin: 4.9 g/dL (ref 3.5–5.2)
Alkaline Phosphatase: 78 U/L (ref 39–117)
Bilirubin, Direct: 0.1 mg/dL (ref 0.0–0.3)
Total Bilirubin: 0.5 mg/dL (ref 0.2–1.2)
Total Protein: 7.5 g/dL (ref 6.0–8.3)

## 2021-07-11 LAB — HEMOGLOBIN A1C: Hgb A1c MFr Bld: 5 % (ref 4.6–6.5)

## 2021-07-11 LAB — TSH: TSH: 1.29 u[IU]/mL (ref 0.35–5.50)

## 2021-07-11 MED ORDER — FUROSEMIDE 20 MG PO TABS: 20.0000 mg | ORAL_TABLET | Freq: Every day | ORAL | 5 refills | Status: DC

## 2021-07-11 MED ORDER — TIRZEPATIDE 2.5 MG/0.5ML ~~LOC~~ SOAJ: 2.5000 mg | SUBCUTANEOUS | 0 refills | Status: DC

## 2021-07-11 NOTE — Progress Notes (Signed)
? ?  Subjective:  ? ? Patient ID: Cheryl Ritter, female    DOB: 1970/07/21, 51 y.o.   MRN: [id number] ? ?HPI ?Here to ask about weight management. She walks an hour every day ans she eats a healthy diet, but she has been unable to lose any weight overt he past year. She wants to try a medication to help with this. She feels well.  ? ? ?Review of Systems  ?Constitutional: Negative.   ?Respiratory: Negative.    ?Cardiovascular: Negative.   ?Endocrine: Negative.   ? ?   ?Objective:  ? Physical Exam ?Constitutional:   ?   Appearance: Normal appearance.  ?Cardiovascular:  ?   Rate and Rhythm: Normal rate and regular rhythm.  ?   Pulses: Normal pulses.  ?   Heart sounds: Normal heart sounds.  ?Pulmonary:  ?   Effort: Pulmonary effort is normal.  ?   Breath sounds: Normal breath sounds.  ?Neurological:  ?   Mental Status: She is alert.  ? ? ? ? ? ?   ?Assessment & Plan:  ?Weight management. We will get fasting labs today including a baseline A1c. She will start on Mounjaro 2.5 mg weekly, and she will come back in 4 weeks. ?Alysia Penna, MD ? ? ?

## 2021-07-19 ENCOUNTER — Telehealth: Payer: Self-pay

## 2021-07-19 NOTE — Telephone Encounter (Signed)
Pt PA for Cheryl Ritter was sent to plan on 07/19/2021

## 2021-07-21 ENCOUNTER — Telehealth: Payer: Self-pay | Admitting: Family Medicine

## 2021-07-21 MED ORDER — TIRZEPATIDE 2.5 MG/0.5ML ~~LOC~~ SOAJ: 2.5000 mg | SUBCUTANEOUS | 0 refills | Status: DC

## 2021-07-21 NOTE — Telephone Encounter (Signed)
The pharmacy never got the [facility], so I sent it in again

## 2021-08-18 ENCOUNTER — Telehealth: Payer: Self-pay | Admitting: Family Medicine

## 2021-08-18 ENCOUNTER — Ambulatory Visit: Payer: 59 | Admitting: Family Medicine

## 2021-08-18 MED ORDER — TIRZEPATIDE 5 MG/0.5ML ~~LOC~~ SOAJ: 5.0000 mg | SUBCUTANEOUS | 0 refills | Status: DC

## 2021-08-18 NOTE — Telephone Encounter (Signed)
Pt called stating she completely forgot about her appt today and what she wanted to do was to talk to Dr. Clent Ridges concerning refilling her [facility] and increasing the dosage to 5.0 ML.   She also would like a physical copy of the Rx refill order so she can take it to different pharm. If you can call her so she can pick up the physical copy or email her that would be greatly appreciated.   Pt is aware of the $50 no show fee for missing her appt.   Please advise.

## 2021-08-18 NOTE — Telephone Encounter (Signed)
Spoke with pt advised to pick up Rx at the office, Rx placed at the front office cabinet

## 2021-09-01 NOTE — Telephone Encounter (Signed)
Called pt confirmed that she picked up Rx from the office

## 2021-11-24 ENCOUNTER — Other Ambulatory Visit: Payer: Self-pay | Admitting: Family Medicine

## 2022-02-27 ENCOUNTER — Other Ambulatory Visit: Payer: Self-pay | Admitting: Obstetrics and Gynecology

## 2022-02-27 DIAGNOSIS — Z1231 Encounter for screening mammogram for malignant neoplasm of breast: Secondary | ICD-10-CM

## 2022-03-16 ENCOUNTER — Other Ambulatory Visit: Payer: Self-pay | Admitting: Family Medicine

## 2022-04-18 ENCOUNTER — Ambulatory Visit
Admission: RE | Admit: 2022-04-18 | Discharge: 2022-04-18 | Disposition: A | Discharge status: Home / Self Care | Payer: 59 | Source: Ambulatory Visit | Attending: Obstetrics and Gynecology | Admitting: Obstetrics and Gynecology

## 2022-04-18 DIAGNOSIS — Z1231 Encounter for screening mammogram for malignant neoplasm of breast: Secondary | ICD-10-CM

## 2022-06-27 ENCOUNTER — Other Ambulatory Visit: Payer: Self-pay | Admitting: Family Medicine

## 2022-06-28 NOTE — Telephone Encounter (Signed)
Pt LOV was done on 07/11/21 Last refill was done on 03/16/22 Please advise if ok to refill same dose

## 2022-07-31 IMAGING — MG DIGITAL SCREENING BREAST BILAT IMPLANT W/ TOMO W/ CAD
8 of 12 series · 8 of 28 positions shown · non-contrast
Comparison: Prior films

CLINICAL DATA: Screening.

EXAM:
DIGITAL SCREENING BILATERAL MAMMOGRAM WITH IMPLANTS, CAD AND
TOMOSYNTHESIS
TECHNIQUE: Bilateral screening digital craniocaudal and mediolateral oblique
mammograms were obtained. Bilateral screening digital breast
tomosynthesis was performed. The images were evaluated with
computer-aided detection. Standard and/or implant displaced views
were performed.

[L CC]
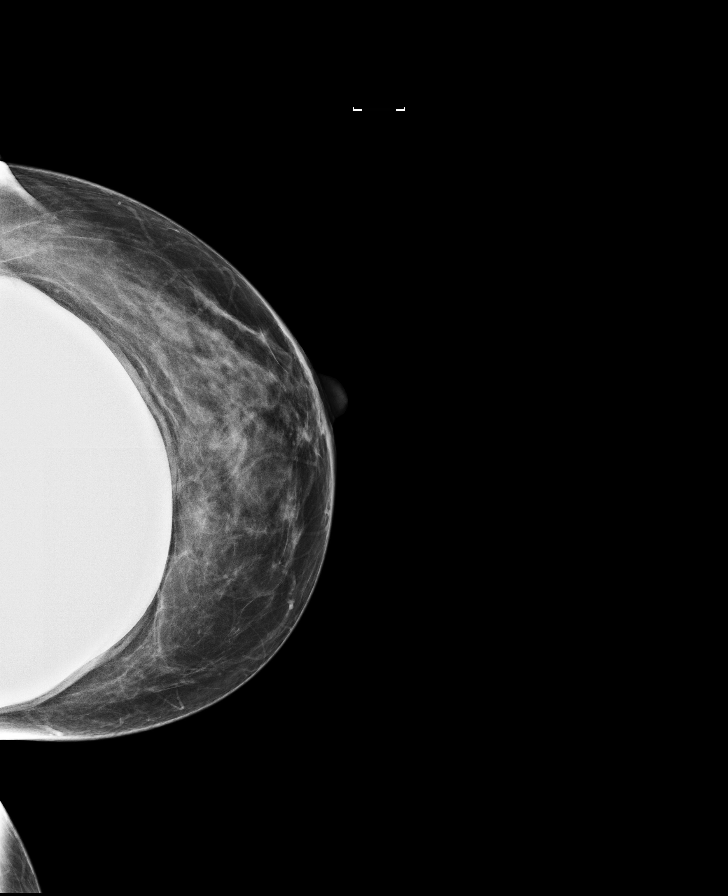

[R CC]
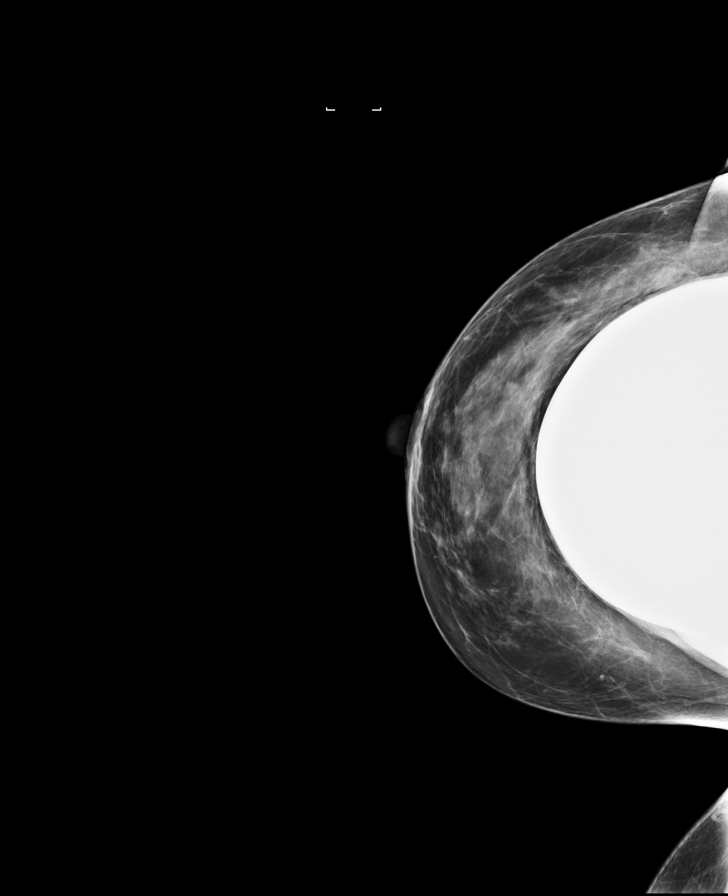

[L MLO]
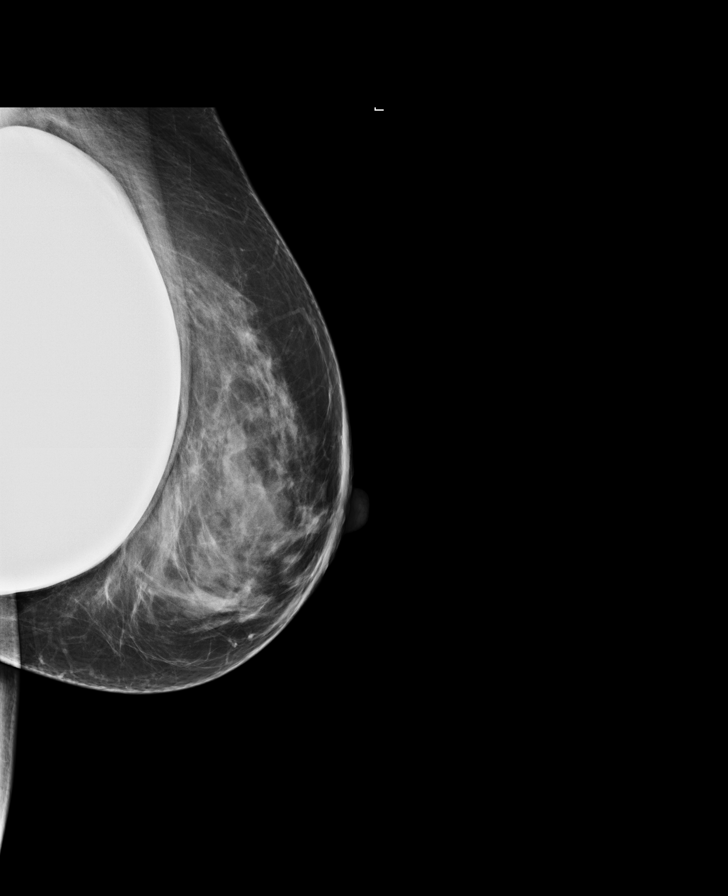

[R MLO]
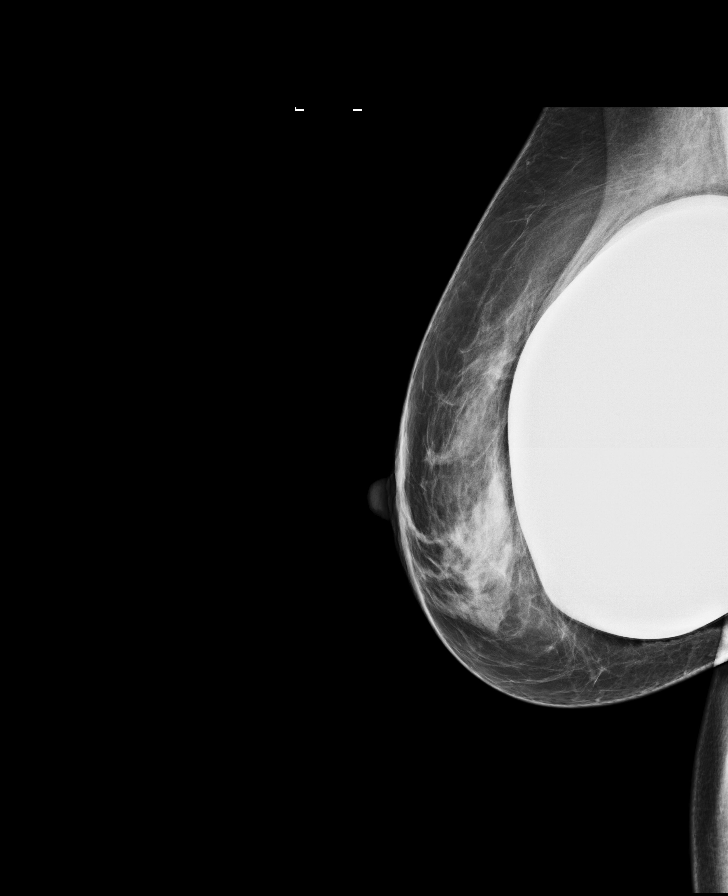

[R MLO synth-2D]
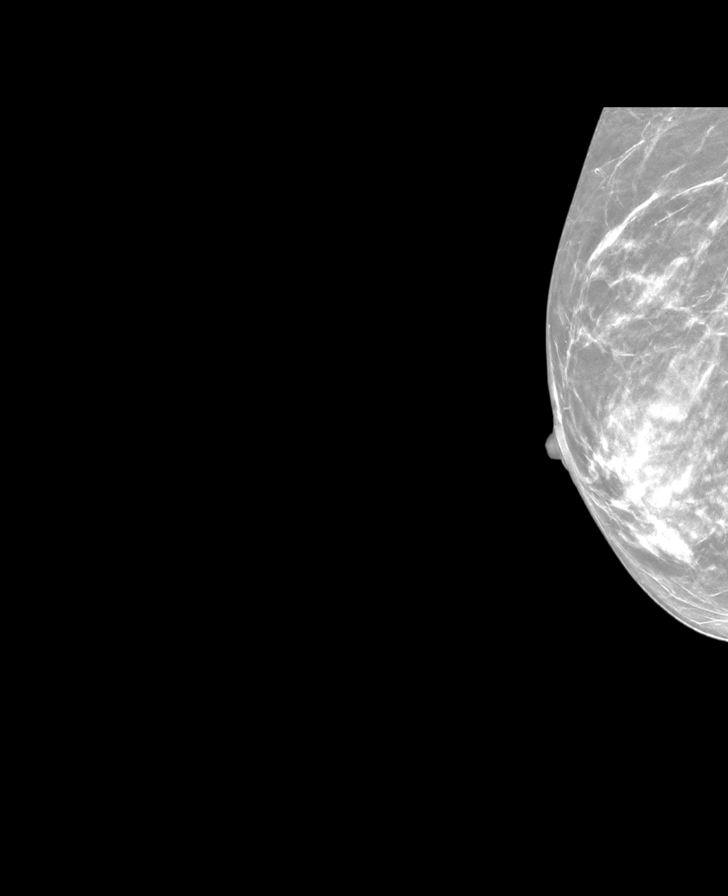

[R CC synth-2D]
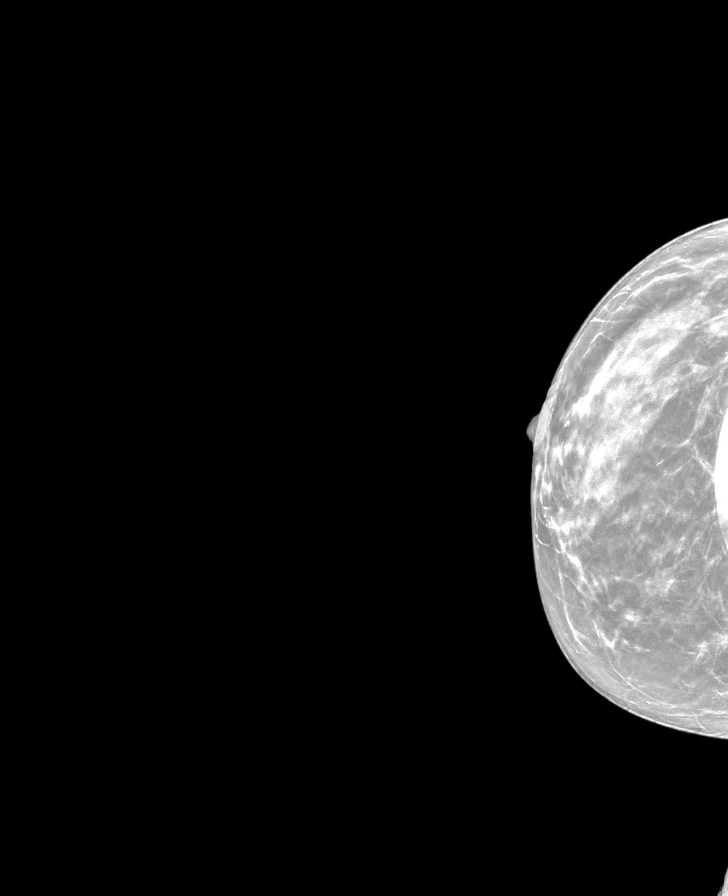

[L CC synth-2D]
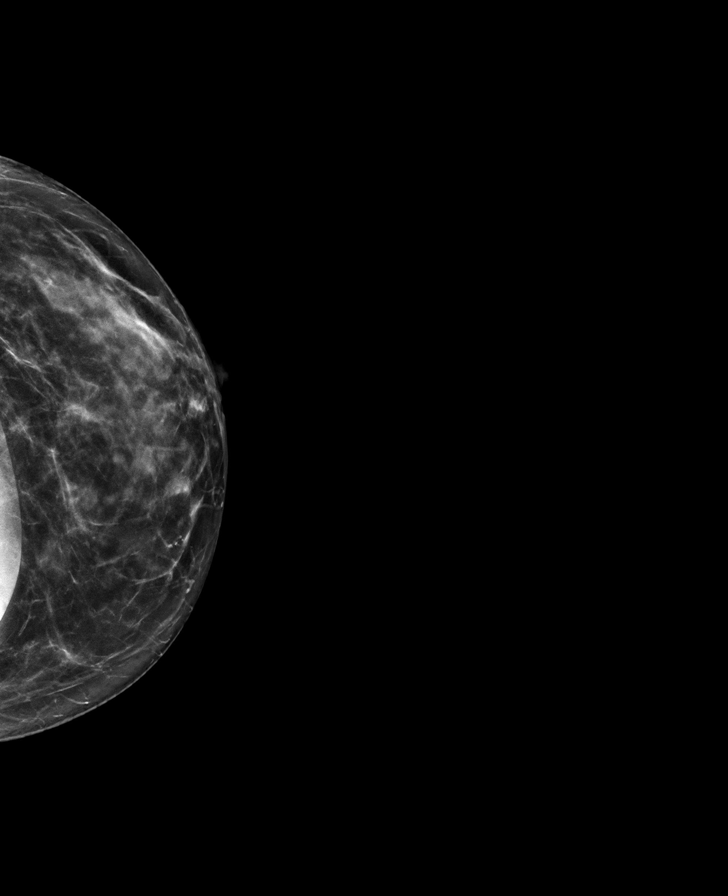

[L MLO synth-2D]
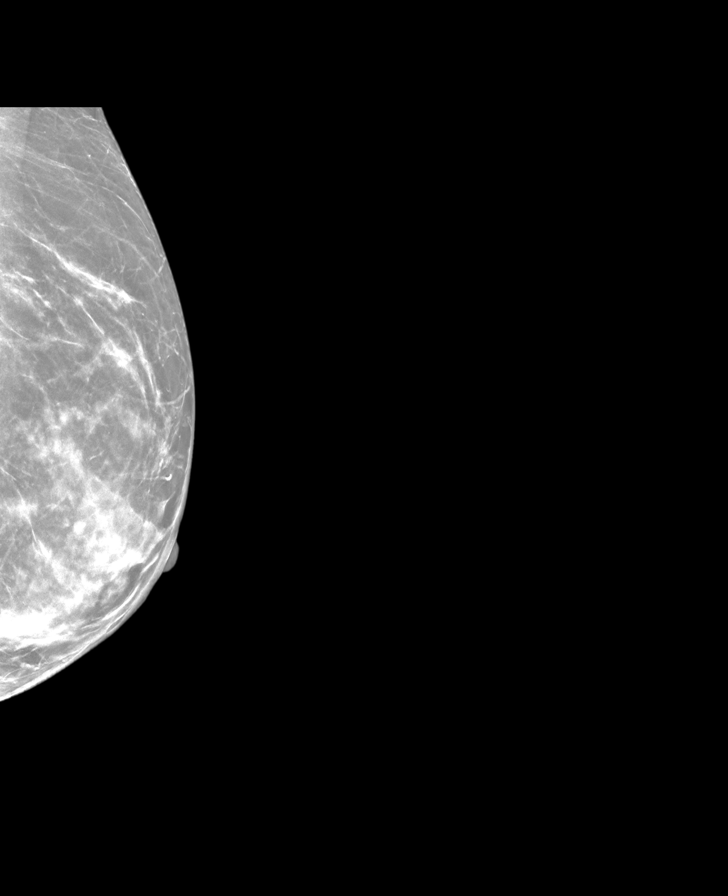

[8 of 28 positions shown; findings below may reference images not displayed]

ACR Breast Density Category c: The breast tissue is heterogeneously
dense, which may obscure small masses.
FINDINGS: The patient has implants. There are no findings suspicious for
malignancy.
IMPRESSION: No mammographic evidence of malignancy. A result letter of this
screening mammogram will be mailed directly to the patient.

RECOMMENDATION:
Screening mammogram in one year. (Code:T1-X-THI)

BI-RADS CATEGORY  1:  Negative.

## 2022-09-03 ENCOUNTER — Telehealth: Payer: Self-pay | Admitting: Family Medicine

## 2022-09-03 NOTE — Telephone Encounter (Signed)
Left detailed message for pt to call the office and schedule appointment for OV/CPE/Med refill

## 2022-09-03 NOTE — Telephone Encounter (Signed)
Prescription Request  09/03/2022  LOV: Visit date not found  What is the name of the medication or equipment? tirzepatide [facility]) 5 MG/0.5ML Pen   Have you contacted your pharmacy to request a refill? Yes   Which pharmacy would you like this sent to?   [id number] - [place], [place] - [address] AT [address] [place] [address] Phone: [phone] Fax: [phone]    Patient notified that their request is being sent to the clinical staff for review and that they should receive a response within 2 business days.   Please advise at Mobile [phone] (mobile)

## 2022-09-04 NOTE — Telephone Encounter (Signed)
Pt has been sch for 09-05-2022 at 130 pm

## 2022-09-05 ENCOUNTER — Ambulatory Visit: Payer: 59 | Admitting: Family Medicine

## 2022-09-05 ENCOUNTER — Encounter: Payer: Self-pay | Admitting: Family Medicine

## 2022-09-05 VITALS — BP 110/78 | HR 75 | Temp 98.2°F | Wt 141.8 lb

## 2022-09-05 DIAGNOSIS — E663 Overweight: Secondary | ICD-10-CM

## 2022-09-05 DIAGNOSIS — Z6821 Body mass index (BMI) 21.0-21.9, adult: Secondary | ICD-10-CM

## 2022-09-05 MED ORDER — MOUNJARO 5 MG/0.5ML ~~LOC~~ SOAJ: SUBCUTANEOUS | 5 refills | Status: DC

## 2022-09-05 NOTE — Progress Notes (Signed)
   Subjective:    Patient ID: Cheryl Ritter, female    DOB: [birth year], 52 y.o.   MRN: [id number]  HPI Here to follow up on Mounjaro. She started on the 2.5 mg dose, and we quickly went up to the 5 mg dose weekly. This worked very well for her, and she lost about 15 lbs. Then her pharmacy had trouble getting this in, so she went back on the 2.5 mg dose. She has gained about 6 lbs back ,and she says it doesn't really help her. No side effects to report.    Review of Systems  Constitutional: Negative.   Respiratory: Negative.    Cardiovascular: Negative.   Gastrointestinal: Negative.        Objective:   Physical Exam Constitutional:      Appearance: Normal appearance.  Cardiovascular:     Rate and Rhythm: Normal rate and regular rhythm.     Pulses: Normal pulses.     Heart sounds: Normal heart sounds.  Pulmonary:     Effort: Pulmonary effort is normal.     Breath sounds: Normal breath sounds.  Neurological:     Mental Status: She is alert.           Assessment & Plan:  Overweight, we will increase the Mounjaro back to 5 mg weekly. Recheck in 6 months.  Gershon Crane, MD

## 2022-09-06 ENCOUNTER — Other Ambulatory Visit: Payer: Self-pay | Admitting: Family Medicine

## 2022-09-28 ENCOUNTER — Telehealth: Payer: Self-pay | Admitting: Family Medicine

## 2022-09-28 DIAGNOSIS — Z136 Encounter for screening for cardiovascular disorders: Secondary | ICD-10-CM

## 2022-09-28 NOTE — Telephone Encounter (Signed)
Per her request, we will order a cardiac CT screen

## 2022-11-29 ENCOUNTER — Ambulatory Visit (HOSPITAL_COMMUNITY): Payer: 59

## 2023-01-03 ENCOUNTER — Ambulatory Visit (HOSPITAL_COMMUNITY): Payer: 59 | Attending: Family Medicine

## 2023-03-15 ENCOUNTER — Other Ambulatory Visit: Payer: Self-pay | Admitting: Obstetrics and Gynecology

## 2023-03-15 DIAGNOSIS — Z1231 Encounter for screening mammogram for malignant neoplasm of breast: Secondary | ICD-10-CM

## 2023-04-04 ENCOUNTER — Other Ambulatory Visit (HOSPITAL_COMMUNITY): Payer: Self-pay

## 2023-04-05 ENCOUNTER — Other Ambulatory Visit (HOSPITAL_COMMUNITY): Payer: Self-pay

## 2023-04-22 ENCOUNTER — Ambulatory Visit
Admission: RE | Admit: 2023-04-22 | Discharge: 2023-04-22 | Disposition: A | Discharge status: Home / Self Care | Payer: 59 | Source: Ambulatory Visit | Attending: Obstetrics and Gynecology | Admitting: Obstetrics and Gynecology

## 2023-04-22 DIAGNOSIS — Z1231 Encounter for screening mammogram for malignant neoplasm of breast: Secondary | ICD-10-CM

## 2023-04-23 ENCOUNTER — Telehealth: Payer: Self-pay

## 2023-04-23 ENCOUNTER — Other Ambulatory Visit (HOSPITAL_COMMUNITY): Payer: Self-pay

## 2023-04-23 NOTE — Telephone Encounter (Signed)
 I suggest we try Zepbound instead. If this is okay with Adamarie, I will send this to her [facility]

## 2023-04-23 NOTE — Telephone Encounter (Signed)
 Pharmacy Patient Advocate Encounter   Received notification from Onbase that prior authorization for [facility] 5MG /0.5ML auto-injectors is required/requested.   Insurance verification completed.   The patient is insured through [facility] .    Greggory Keen is only approved for type 2 diabetes. No indication of diabetes on patient's chart.

## 2023-04-24 ENCOUNTER — Other Ambulatory Visit (HOSPITAL_COMMUNITY): Payer: Self-pay

## 2023-04-24 NOTE — Telephone Encounter (Signed)
 She says that her pharmacy is requiring a PA on her Mounjaro. Please look into this

## 2023-04-25 MED ORDER — TIRZEPATIDE-WEIGHT MANAGEMENT 2.5 MG/0.5ML ~~LOC~~ SOLN: 2.5000 mg | SUBCUTANEOUS | 5 refills | Status: DC

## 2023-04-25 NOTE — Telephone Encounter (Signed)
 Noted.

## 2023-04-25 NOTE — Addendum Note (Signed)
 Addended by: Gershon Crane A on: 04/25/2023 12:47 PM   Modules accepted: Orders

## 2023-04-25 NOTE — Telephone Encounter (Signed)
 I sent in the Zepbound

## 2023-04-25 NOTE — Telephone Encounter (Signed)
 Pt is ok with trying Zepbound, [facility] [address]

## 2023-04-26 ENCOUNTER — Other Ambulatory Visit: Payer: Self-pay | Admitting: Obstetrics and Gynecology

## 2023-04-26 DIAGNOSIS — R928 Other abnormal and inconclusive findings on diagnostic imaging of breast: Secondary | ICD-10-CM

## 2023-05-01 ENCOUNTER — Ambulatory Visit: Payer: 59

## 2023-05-01 ENCOUNTER — Telehealth: Payer: Self-pay | Admitting: Pharmacy Technician

## 2023-05-01 ENCOUNTER — Other Ambulatory Visit (HOSPITAL_COMMUNITY): Payer: Self-pay

## 2023-05-01 ENCOUNTER — Ambulatory Visit
Admission: RE | Admit: 2023-05-01 | Discharge: 2023-05-01 | Disposition: A | Discharge status: Home / Self Care | Payer: 59 | Source: Ambulatory Visit | Attending: Obstetrics and Gynecology | Admitting: Obstetrics and Gynecology

## 2023-05-01 DIAGNOSIS — R928 Other abnormal and inconclusive findings on diagnostic imaging of breast: Secondary | ICD-10-CM

## 2023-05-01 NOTE — Telephone Encounter (Signed)
 Prior Authorization form/request asks a question that requires your assistance. Please see the question below and advise accordingly. The PA will not be submitted until the necessary information is received.

## 2023-05-01 NOTE — Telephone Encounter (Signed)
 Notified pt of the conformation needed, states that she will try get some information regarding the diet program she is currently with.

## 2023-05-02 ENCOUNTER — Other Ambulatory Visit: Payer: Self-pay | Admitting: Family Medicine

## 2023-05-02 ENCOUNTER — Telehealth: Payer: Self-pay | Admitting: *Deleted

## 2023-05-02 NOTE — Telephone Encounter (Signed)
 Copied from CRM [id number]. Topic: Clinical - Prescription Issue >> May 02, 2023  1:41 PM Elizebeth Brooking wrote: Reason for CRM: Patient called in stating a new place to send prescription in so she can get tirzepatide (ZEPBOUND) 2.5 MG/0.5ML injection vial She stated to send it here at  [facility]  NPI ; [id number] NCPDP; [phone]

## 2023-05-03 ENCOUNTER — Telehealth: Payer: Self-pay

## 2023-05-03 MED ORDER — TIRZEPATIDE-WEIGHT MANAGEMENT 2.5 MG/0.5ML ~~LOC~~ SOLN: 2.5000 mg | SUBCUTANEOUS | 5 refills | Status: DC

## 2023-05-03 NOTE — Telephone Encounter (Signed)
 Copied from CRM [id number]. Topic: Clinical - Medication Question >> May 03, 2023  3:50 PM Almira Coaster wrote: Reason for CRM: Patient is calling to advise that she spoke with [employer] regarding her Zepbound prescription and documentation of her diet, and they informed her that documentation was not required for the authorization. They informed patient that all that was needed was the provider's office to answer yes to the question that patient has been on a diet. She would like to speak to the nurse if possible. Best call back number is [phone]

## 2023-05-03 NOTE — Telephone Encounter (Signed)
 I sent this to [facility]

## 2023-05-06 NOTE — Telephone Encounter (Signed)
 PA request has been Started. New Encounter has been or will be created for follow up. For additional info see Pharmacy Prior Auth telephone encounter from 05/01/23.

## 2023-05-06 NOTE — Telephone Encounter (Signed)
 Per separate encounters, Zepbound was sent to [facility] for self pay. Please advise if PA is still required. Also, last weigh in was in July and patient's BMI was only 21.56. Most insurances will only cover if BMI is over 27 with obesity-related comorbidities.

## 2023-05-07 ENCOUNTER — Other Ambulatory Visit (HOSPITAL_COMMUNITY): Payer: Self-pay

## 2023-05-07 NOTE — Telephone Encounter (Signed)
 Noted.

## 2023-05-08 ENCOUNTER — Other Ambulatory Visit (HOSPITAL_COMMUNITY): Payer: Self-pay

## 2023-05-08 NOTE — Telephone Encounter (Signed)
 Not for now awaiting pt to call the office with information regarding the diet program that she has been involved in.

## 2023-09-09 ENCOUNTER — Telehealth: Payer: Self-pay

## 2023-09-09 NOTE — Telephone Encounter (Signed)
 Copied from CRM [id number]. Topic: Clinical - Medication Question >> Sep 06, 2023 12:36 PM Larissa S wrote: Reason for CRM: Patient wanting to know if medication tirzepatide  (ZEPBOUND ) 2.5 MG/0.5ML injection vial can be increased to 5mg .    Callback # [phone]

## 2023-09-10 MED ORDER — TIRZEPATIDE-WEIGHT MANAGEMENT 5 MG/0.5ML ~~LOC~~ SOLN: 5.0000 mg | SUBCUTANEOUS | 5 refills | Status: DC

## 2023-09-10 NOTE — Telephone Encounter (Signed)
 I sent in a RX for the 5 mg dose

## 2023-09-10 NOTE — Addendum Note (Signed)
 Addended by: JOHNNY SENIOR A on: 09/10/2023 08:04 AM   Modules accepted: Orders

## 2023-09-18 ENCOUNTER — Other Ambulatory Visit: Payer: Self-pay

## 2023-09-18 ENCOUNTER — Telehealth: Payer: Self-pay

## 2023-09-18 MED ORDER — TIRZEPATIDE-WEIGHT MANAGEMENT 5 MG/0.5ML ~~LOC~~ SOLN: 5.0000 mg | SUBCUTANEOUS | 5 refills | Status: AC

## 2023-09-18 NOTE — Telephone Encounter (Signed)
 Pt Rx for Tirzepatide  was resent to [facility] Copied from CRM [id number]. Topic: Clinical - Prescription Issue >> Sep 18, 2023 12:32 PM Mercedes MATSU wrote: Reason for CRM: Patient called in stating that her tirzepatide  5 MG/0.5ML injection vial went to the wrong pharmacy,it needs to be resent to:  [facility] - [place], [place] - [phone] [place] [address] Phone: [phone] Fax: [phone] Hours: Not open 24 hours  Patient is requesting a call back [phone] to confirm the switch has been made.
